# Patient Record
Sex: Male | Born: 1960
Health system: Southern US, Community
[De-identification: ages and names within clinical notes are randomized; demographics above are authoritative.]

## PROBLEM LIST (undated history)

## (undated) DIAGNOSIS — I10 Essential (primary) hypertension: Secondary | ICD-10-CM

## (undated) DIAGNOSIS — N486 Induration penis plastica: Secondary | ICD-10-CM

## (undated) DIAGNOSIS — R002 Palpitations: Secondary | ICD-10-CM

## (undated) DIAGNOSIS — I252 Old myocardial infarction: Secondary | ICD-10-CM

## (undated) DIAGNOSIS — I2510 Atherosclerotic heart disease of native coronary artery without angina pectoris: Secondary | ICD-10-CM

## (undated) DIAGNOSIS — Z973 Presence of spectacles and contact lenses: Secondary | ICD-10-CM

## (undated) DIAGNOSIS — Z9289 Personal history of other medical treatment: Secondary | ICD-10-CM

## (undated) DIAGNOSIS — E785 Hyperlipidemia, unspecified: Secondary | ICD-10-CM

## (undated) DIAGNOSIS — S52502A Unspecified fracture of the lower end of left radius, initial encounter for closed fracture: Secondary | ICD-10-CM

## (undated) HISTORY — DX: Unspecified fracture of the lower end of left radius, initial encounter for closed fracture: S52.502A

## (undated) HISTORY — PX: NO PAST SURGERIES: SHX2092

## (undated) HISTORY — DX: Essential (primary) hypertension: I10

---

## 1987-03-30 ENCOUNTER — Emergency Department (HOSPITAL_COMMUNITY): Payer: Self-pay

## 1999-01-08 ENCOUNTER — Emergency Department (HOSPITAL_COMMUNITY): Admission: EM | Admit: 1999-01-08 | Discharge: 1999-01-08 | Payer: Self-pay | Admitting: Emergency Medicine

## 1999-06-07 ENCOUNTER — Emergency Department (HOSPITAL_COMMUNITY): Admission: EM | Admit: 1999-06-07 | Discharge: 1999-06-07 | Payer: Self-pay | Admitting: Emergency Medicine

## 2005-10-10 ENCOUNTER — Emergency Department (HOSPITAL_COMMUNITY): Admission: EM | Admit: 2005-10-10 | Discharge: 2005-10-10 | Payer: Self-pay | Admitting: Emergency Medicine

## 2012-03-03 ENCOUNTER — Inpatient Hospital Stay (HOSPITAL_BASED_OUTPATIENT_CLINIC_OR_DEPARTMENT_OTHER)
Admission: EM | Admit: 2012-03-03 | Discharge: 2012-03-03 | Disposition: A | Discharge status: Home / Self Care | Payer: Self-pay

## 2012-10-03 ENCOUNTER — Inpatient Hospital Stay (HOSPITAL_BASED_OUTPATIENT_CLINIC_OR_DEPARTMENT_OTHER)
Admission: EM | Admit: 2012-10-03 | Discharge: 2012-10-03 | Disposition: A | Discharge status: Home / Self Care | Payer: Self-pay

## 2012-10-18 ENCOUNTER — Ambulatory Visit (INDEPENDENT_AMBULATORY_CARE_PROVIDER_SITE_OTHER): Payer: BC Managed Care – PPO | Admitting: Family Medicine

## 2012-10-18 VITALS — BP 144/82 | HR 94 | Temp 98.7°F | Resp 17 | Ht 70.5 in | Wt 213.0 lb

## 2012-10-18 DIAGNOSIS — T148XXA Other injury of unspecified body region, initial encounter: Secondary | ICD-10-CM

## 2012-10-18 DIAGNOSIS — R509 Fever, unspecified: Secondary | ICD-10-CM

## 2012-10-18 DIAGNOSIS — K6289 Other specified diseases of anus and rectum: Secondary | ICD-10-CM

## 2012-10-18 LAB — POCT CBC
Granulocyte percent: 63.5 %G (ref 37–80)
HCT, POC: 38.2 % — AB (ref 43.5–53.7)
Hemoglobin: 12.1 g/dL — AB (ref 14.1–18.1)
Lymph, poc: 2.2 (ref 0.6–3.4)
MCH, POC: 30.6 pg (ref 27–31.2)
MCHC: 31.7 g/dL — AB (ref 31.8–35.4)
MCV: 96.8 fL (ref 80–97)
MID (cbc): 0.5 (ref 0–0.9)
MPV: 8.6 fL (ref 0–99.8)
POC Granulocyte: 4.6 (ref 2–6.9)
POC LYMPH PERCENT: 29.6 %L (ref 10–50)
POC MID %: 6.9 %M (ref 0–12)
Platelet Count, POC: 271 10*3/uL (ref 142–424)
RBC: 3.95 M/uL — AB (ref 4.69–6.13)
RDW, POC: 12.2 %
WBC: 7.3 10*3/uL (ref 4.6–10.2)

## 2012-10-18 LAB — IFOBT (OCCULT BLOOD): IFOBT: NEGATIVE

## 2012-10-18 LAB — POCT INFLUENZA A/B
Influenza A, POC: NEGATIVE
Influenza B, POC: NEGATIVE

## 2012-10-18 MED ORDER — LIDOCAINE-PRILOCAINE 2.5-2.5 % EX CREA: TOPICAL_CREAM | CUTANEOUS | Status: DC | PRN

## 2012-10-18 NOTE — Progress Notes (Signed)
[facility]:  Office Visit  Chief Complaint:  Chief Complaint  Patient presents with  . Influenza  . Nausea  . Back Pain    HPI: Alan Walker is a 52 y.o. male who complains of  3 day history of subjective fever, chills, dry cough. When he coughs he has pain in his  Rectum, sharp, throbbing, then it resolves. Denies constipation, diarrhea, blood in urine or stool. He is a [place] male, denies rectal sex, or engaging in any sex toys. No rashes/lesions.  + nausea with cough. Has not tried anything for it. UTD  flu vaccine.   Past Medical History  Diagnosis Date  . Asthma   . Hypertension    History reviewed. No pertinent past surgical history. History   Social History  . Marital Status: Married    Spouse Name: N/A    Number of Children: N/A  . Years of Education: N/A   Social History Main Topics  . Smoking status: Never Smoker   . Smokeless tobacco: None  . Alcohol Use: No  . Drug Use: No  . Sexually Active: Yes    Birth Control/ Protection: Abstinence   Other Topics Concern  . None   Social History Narrative  . None   History reviewed. No pertinent family history. No Known Allergies Prior to Admission medications   Not on File     ROS: The patient denies night sweats, unintentional weight loss, chest pain, palpitations, wheezing, dyspnea on exertion, nausea, vomiting, abdominal pain, dysuria, hematuria, melena, numbness, weakness, or tingling.   All other systems have been reviewed and were otherwise negative with the exception of those mentioned in the HPI and as above.    PHYSICAL EXAM: Filed Vitals:   10/18/12 1120  BP: 144/82  Pulse: 94  Temp: 98.7 F (37.1 C)  Resp: 17   Filed Vitals:   10/18/12 1120  Height: 5' 10.5" (1.791 m)  Weight: 213 lb (96.616 kg)   Body mass index is 30.13 kg/(m^2).  General: Alert, no acute distress HEENT:  Normocephalic, atraumatic, oropharynx patent.  Cardiovascular:  Regular rate and  rhythm, no rubs murmurs or gallops.  No Carotid bruits, radial pulse intact. No pedal edema.  Respiratory: Clear to auscultation bilaterally.  No wheezes, rales, or rhonchi.  No cyanosis, no use of accessory musculature GI: No organomegaly, abdomen is soft and non-tender, positive bowel sounds.  No masses. Skin: No rashes. Neurologic: Facial musculature symmetric. Psychiatric: Patient is appropriate throughout our interaction. Lymphatic: No cervical lymphadenopathy Musculoskeletal: Gait intact. GU-no masses, lesions, there is rectal abrasion at base of anus   LABS: Results for orders placed in visit on 10/18/12  POCT INFLUENZA A/B      Component Value Range   Influenza A, POC Negative     Influenza B, POC Negative    POCT CBC      Component Value Range   WBC 7.3  4.6 - 10.2 K/uL   Lymph, poc 2.2  0.6 - 3.4   POC LYMPH PERCENT 29.6  10 - 50 %L   MID (cbc) 0.5  0 - 0.9   POC MID % 6.9  0 - 12 %M   POC Granulocyte 4.6  2 - 6.9   Granulocyte percent 63.5  37 - 80 %G   RBC 3.95 (*) 4.69 - 6.13 M/uL   Hemoglobin 12.1 (*) 14.1 - 18.1 g/dL   HCT, POC 46.9 (*) 62.9 - 53.7 %   MCV 96.8  80 -  97 fL   MCH, POC 30.6  27 - 31.2 pg   MCHC 31.7 (*) 31.8 - 35.4 g/dL   RDW, POC 69.6     Platelet Count, POC 271  142 - 424 K/uL   MPV 8.6  0 - 99.8 fL  IFOBT (OCCULT BLOOD)      Component Value Range   IFOBT Negative       EKG/XRAY:   Primary read interpreted by Dr. Conley Rolls at [facility].   ASSESSMENT/PLAN: Encounter Diagnoses  Name Primary?  . Rectal pain Yes  . Fever and chills   . Abrasion    Viral illness-otc treatment Emla cream for rectal pain secondary to ? Rectal abrasion F/u prn      Alan Goth PHUONG, DO 10/20/2012 11:00 AM

## 2012-10-20 ENCOUNTER — Telehealth: Payer: Self-pay | Admitting: Family Medicine

## 2012-10-20 NOTE — Telephone Encounter (Signed)
error 

## 2014-09-16 ENCOUNTER — Ambulatory Visit (INDEPENDENT_AMBULATORY_CARE_PROVIDER_SITE_OTHER): Payer: BC Managed Care – PPO | Admitting: Physician Assistant

## 2014-09-16 ENCOUNTER — Ambulatory Visit (INDEPENDENT_AMBULATORY_CARE_PROVIDER_SITE_OTHER): Payer: BC Managed Care – PPO

## 2014-09-16 VITALS — BP 122/74 | HR 82 | Temp 97.4°F | Resp 17 | Ht 69.5 in | Wt 218.0 lb

## 2014-09-16 DIAGNOSIS — M25561 Pain in right knee: Secondary | ICD-10-CM

## 2014-09-16 MED ORDER — MELOXICAM 15 MG PO TABS: 15.0000 mg | ORAL_TABLET | Freq: Every day | ORAL | Status: DC

## 2014-09-16 NOTE — Patient Instructions (Signed)
Your knee xray was normal.  I suspect the pain and swelling you're having is due to overuse as it is painful both with too much exertion and becomes stiff with too much rest.  Please take the meloxicam once daily for the next 2-3 weeks.  Please be sure to ice after activities that make it painful.  Please continue to wear the knee brace daily.  If you're not feeling better in 2-3 weeks, please give [place] a call so we can refer you to ortho to ensure you didn't have a small meniscus tear or any other injury that's causing you this pain.

## 2014-09-16 NOTE — Progress Notes (Signed)
   Subjective:    Patient ID: MORIO WIDEN, male    DOB: 1961-02-07, 53 y.o.   MRN: [id number]  PCP: No primary care provider on file.  Chief Complaint  Patient presents with  . Knee Pain    right knee    There are no active problems to display for this patient.  Prior to Admission medications   Not on File   Medications, allergies, past medical history, surgical history, family history, social history and problem list reviewed and updated.  HPI  40 yom with no pertinent pmh presents with right knee pain.  He ran a 5K 3 wks ago did well. He trained for approx 2 wks prior to the event. Had no pain with running. After he drove home after the event and stepped down out of his car he had sudden onset sharp stabbing pains located both sides of knee and around to the back. No radiation of pain. Pain lasted for 2-3 mins then resolved on own. Since this event he has numerous episodes of similar pain. Mostly comes on when he sits too long and also when he walks for a while. He has noticed some swelling around his knee as well.   Review of Systems No CP, SOB, fever, chills, pleurisy, hemoptysis.     Objective:   Physical Exam  Constitutional: He is oriented to person, place, and time. He appears well-developed and well-nourished.  Non-toxic appearance. He does not have a sickly appearance. He does not appear ill. No distress.  BP 122/74 mmHg  Pulse 82  Temp(Src) 97.4 F (36.3 C) (Oral)  Resp 17  Ht 5' 9.5" (1.765 m)  Wt 218 lb (98.884 kg)  BMI 31.74 kg/m2  SpO2 98%   Musculoskeletal:       Left knee: He exhibits swelling and abnormal meniscus. He exhibits normal range of motion, no effusion, no LCL laxity and no MCL laxity. No tenderness found. No medial joint line, no lateral joint line, no MCL, no LCL and no patellar tendon tenderness noted.  Medial pain with mcl laxity testing. No mcl laxity however. No ttp. No patellar tendon ttp or pes anserine ttp. Negative ant/post drawer  testing. Mild medial pain with grind testing.   Neurological: He is alert and oriented to person, place, and time.   [facility] reading (PRIMARY) by  Dr. Tamala Julian. Findings: Normal. No acute abnormality.      Assessment & Plan:   75 yom with no pertinent pmh presents with right knee pain.  Right knee pain - Plan: DG Knee Complete 4 Views Right, meloxicam (MOBIC) 15 MG tablet --xr normal --pain with testing but no catching or grinding with grind test. No laxity on testing --suspect pain due to overuse - mobic/ice/brace --no ttp to suggest patellar tendonitis or pes anserine bursitis --pt instructed to contact office 2-3 wks if pain not improving, will refer to ortho at that point  Julieta Gutting, PA-C Physician Assistant-Certified [facility]  09/16/2014 11:54 AM

## 2014-09-22 DIAGNOSIS — I219 Acute myocardial infarction, unspecified: Secondary | ICD-10-CM

## 2014-09-22 HISTORY — DX: Acute myocardial infarction, unspecified: I21.9

## 2014-10-20 ENCOUNTER — Encounter (HOSPITAL_COMMUNITY): Payer: Self-pay | Admitting: Cardiovascular Disease

## 2014-10-20 ENCOUNTER — Inpatient Hospital Stay (HOSPITAL_COMMUNITY)
Admission: EM | Admit: 2014-10-20 | Discharge: 2014-10-23 | DRG: 280 | Disposition: A | Discharge status: Home / Self Care | Payer: BLUE CROSS/BLUE SHIELD | Attending: Cardiovascular Disease | Admitting: Cardiovascular Disease

## 2014-10-20 ENCOUNTER — Encounter (HOSPITAL_COMMUNITY)
Admission: EM | Disposition: A | Discharge status: Home / Self Care | Payer: Self-pay | Source: Home / Self Care | Attending: Cardiovascular Disease

## 2014-10-20 DIAGNOSIS — I10 Essential (primary) hypertension: Secondary | ICD-10-CM | POA: Diagnosis present

## 2014-10-20 DIAGNOSIS — I2102 ST elevation (STEMI) myocardial infarction involving left anterior descending coronary artery: Principal | ICD-10-CM | POA: Diagnosis present

## 2014-10-20 DIAGNOSIS — E785 Hyperlipidemia, unspecified: Secondary | ICD-10-CM | POA: Diagnosis present

## 2014-10-20 DIAGNOSIS — I2542 Coronary artery dissection: Secondary | ICD-10-CM | POA: Diagnosis present

## 2014-10-20 DIAGNOSIS — E876 Hypokalemia: Secondary | ICD-10-CM | POA: Diagnosis present

## 2014-10-20 DIAGNOSIS — I213 ST elevation (STEMI) myocardial infarction of unspecified site: Secondary | ICD-10-CM

## 2014-10-20 DIAGNOSIS — Z8249 Family history of ischemic heart disease and other diseases of the circulatory system: Secondary | ICD-10-CM

## 2014-10-20 DIAGNOSIS — H538 Other visual disturbances: Secondary | ICD-10-CM

## 2014-10-20 HISTORY — PX: LEFT HEART CATHETERIZATION WITH CORONARY ANGIOGRAM: SHX5451

## 2014-10-20 HISTORY — DX: Hyperlipidemia, unspecified: E78.5

## 2014-10-20 LAB — CBC
HCT: 35.8 % — ABNORMAL LOW (ref 39.0–52.0)
Hemoglobin: 12 g/dL — ABNORMAL LOW (ref 13.0–17.0)
MCH: 30.4 pg (ref 26.0–34.0)
MCHC: 33.5 g/dL (ref 30.0–36.0)
MCV: 90.6 fL (ref 78.0–100.0)
Platelets: 246 10*3/uL (ref 150–400)
RBC: 3.95 MIL/uL — ABNORMAL LOW (ref 4.22–5.81)
RDW: 11.8 % (ref 11.5–15.5)
WBC: 7.4 10*3/uL (ref 4.0–10.5)

## 2014-10-20 LAB — POCT I-STAT, CHEM 8
BUN: 16 mg/dL (ref 6–23)
Calcium, Ion: 1.04 mmol/L — ABNORMAL LOW (ref 1.12–1.23)
Chloride: 107 mmol/L (ref 96–112)
Creatinine, Ser: 1 mg/dL (ref 0.50–1.35)
Glucose, Bld: 156 mg/dL — ABNORMAL HIGH (ref 70–99)
HCT: 39 % (ref 39.0–52.0)
Hemoglobin: 13.3 g/dL (ref 13.0–17.0)
Potassium: 2.9 mmol/L — ABNORMAL LOW (ref 3.5–5.1)
Sodium: 142 mmol/L (ref 135–145)
TCO2: 20 mmol/L (ref 0–100)

## 2014-10-20 LAB — COMPREHENSIVE METABOLIC PANEL
ALT: 37 U/L (ref 0–53)
AST: 34 U/L (ref 0–37)
Albumin: 3.8 g/dL (ref 3.5–5.2)
Alkaline Phosphatase: 50 U/L (ref 39–117)
Anion gap: 6 (ref 5–15)
BUN: 15 mg/dL (ref 6–23)
CO2: 26 mmol/L (ref 19–32)
Calcium: 8.4 mg/dL (ref 8.4–10.5)
Chloride: 107 mmol/L (ref 96–112)
Creatinine, Ser: 1.08 mg/dL (ref 0.50–1.35)
GFR calc Af Amer: 89 mL/min — ABNORMAL LOW (ref >90)
GFR calc non Af Amer: 77 mL/min — ABNORMAL LOW (ref >90)
Glucose, Bld: 152 mg/dL — ABNORMAL HIGH (ref 70–99)
Potassium: 2.7 mmol/L — CL (ref 3.5–5.1)
Sodium: 139 mmol/L (ref 135–145)
Total Bilirubin: 0.6 mg/dL (ref 0.3–1.2)
Total Protein: 7.4 g/dL (ref 6.0–8.3)

## 2014-10-20 LAB — LIPID PANEL
Cholesterol: 195 mg/dL (ref 0–200)
HDL: 41 mg/dL (ref >39)
LDL Cholesterol: 138 mg/dL — ABNORMAL HIGH (ref 0–99)
Total CHOL/HDL Ratio: 4.8 RATIO
Triglycerides: 80 mg/dL (ref <150)
VLDL: 16 mg/dL (ref 0–40)

## 2014-10-20 LAB — CK TOTAL AND CKMB (NOT AT ARMC)
CK, MB: 9.1 ng/mL (ref 0.3–4.0)
Relative Index: 2.3 (ref 0.0–2.5)
Total CK: 395 U/L — ABNORMAL HIGH (ref 7–232)

## 2014-10-20 LAB — APTT: aPTT: 66 seconds — ABNORMAL HIGH (ref 24–37)

## 2014-10-20 LAB — PROTIME-INR
INR: 1.19 (ref 0.00–1.49)
Prothrombin Time: 15.2 seconds (ref 11.6–15.2)

## 2014-10-20 LAB — MRSA PCR SCREENING: MRSA by PCR: NEGATIVE

## 2014-10-20 LAB — TROPONIN I
Troponin I: 0.26 ng/mL — ABNORMAL HIGH (ref <0.031)
Troponin I: 53.34 ng/mL (ref <0.031)

## 2014-10-20 LAB — POCT ACTIVATED CLOTTING TIME: Activated Clotting Time: 319 seconds

## 2014-10-20 LAB — BRAIN NATRIURETIC PEPTIDE: B Natriuretic Peptide: 23.7 pg/mL (ref 0.0–100.0)

## 2014-10-20 SURGERY — LEFT HEART CATHETERIZATION WITH CORONARY ANGIOGRAM
Anesthesia: LOCAL

## 2014-10-20 MED ORDER — POTASSIUM CHLORIDE 10 MEQ/100ML IV SOLN
10.0000 meq | INTRAVENOUS | Status: AC
  Administered 2014-10-20 (×4): 10 meq via INTRAVENOUS
  Filled 2014-10-20 (×4): qty 100

## 2014-10-20 MED ORDER — HEPARIN SODIUM (PORCINE) 5000 UNIT/ML IJ SOLN: 5000.0000 [IU] | Freq: Three times a day (TID) | INTRAMUSCULAR | Status: DC

## 2014-10-20 MED ORDER — BIVALIRUDIN 250 MG IV SOLR
INTRAVENOUS | Status: AC
  Filled 2014-10-20: qty 250

## 2014-10-20 MED ORDER — NITROGLYCERIN 1 MG/10 ML FOR IR/CATH LAB
INTRA_ARTERIAL | Status: AC
  Filled 2014-10-20: qty 10

## 2014-10-20 MED ORDER — LOSARTAN POTASSIUM 25 MG PO TABS
25.0000 mg | ORAL_TABLET | Freq: Every day | ORAL | Status: DC
  Administered 2014-10-21 – 2014-10-22 (×2): 25 mg via ORAL
  Filled 2014-10-20 (×3): qty 1

## 2014-10-20 MED ORDER — HYDRALAZINE HCL 20 MG/ML IJ SOLN
10.0000 mg | Freq: Four times a day (QID) | INTRAMUSCULAR | Status: DC | PRN
  Administered 2014-10-20: 10 mg via INTRAVENOUS
  Filled 2014-10-20 (×2): qty 1

## 2014-10-20 MED ORDER — ASPIRIN EC 81 MG PO TBEC
81.0000 mg | DELAYED_RELEASE_TABLET | Freq: Every day | ORAL | Status: DC
  Administered 2014-10-21 – 2014-10-23 (×3): 81 mg via ORAL
  Filled 2014-10-20 (×3): qty 1

## 2014-10-20 MED ORDER — FENTANYL CITRATE 0.05 MG/ML IJ SOLN
INTRAMUSCULAR | Status: AC
  Filled 2014-10-20: qty 2

## 2014-10-20 MED ORDER — ACETAMINOPHEN 325 MG PO TABS
650.0000 mg | ORAL_TABLET | ORAL | Status: DC | PRN
  Administered 2014-10-22: 650 mg via ORAL
  Filled 2014-10-20 (×2): qty 2

## 2014-10-20 MED ORDER — NITROGLYCERIN 0.4 MG SL SUBL: 0.4000 mg | SUBLINGUAL_TABLET | SUBLINGUAL | Status: DC | PRN

## 2014-10-20 MED ORDER — METOPROLOL TARTRATE 25 MG PO TABS
25.0000 mg | ORAL_TABLET | Freq: Two times a day (BID) | ORAL | Status: DC
  Administered 2014-10-20: 25 mg via ORAL
  Filled 2014-10-20 (×3): qty 1

## 2014-10-20 MED ORDER — CETYLPYRIDINIUM CHLORIDE 0.05 % MT LIQD
7.0000 mL | Freq: Two times a day (BID) | OROMUCOSAL | Status: DC
Start: 2014-10-20 — End: 2014-10-21
  Administered 2014-10-20 – 2014-10-21 (×2): 7 mL via OROMUCOSAL

## 2014-10-20 MED ORDER — ONDANSETRON HCL 4 MG/2ML IJ SOLN: 4.0000 mg | Freq: Four times a day (QID) | INTRAMUSCULAR | Status: DC | PRN

## 2014-10-20 MED ORDER — POTASSIUM CHLORIDE CRYS ER 20 MEQ PO TBCR
20.0000 meq | EXTENDED_RELEASE_TABLET | Freq: Two times a day (BID) | ORAL | Status: DC
  Administered 2014-10-20 – 2014-10-23 (×6): 20 meq via ORAL
  Filled 2014-10-20 (×7): qty 1

## 2014-10-20 MED ORDER — MIDAZOLAM HCL 2 MG/2ML IJ SOLN
INTRAMUSCULAR | Status: AC
  Filled 2014-10-20: qty 2

## 2014-10-20 MED ORDER — ASPIRIN 300 MG RE SUPP
300.0000 mg | RECTAL | Status: AC
  Filled 2014-10-20: qty 1

## 2014-10-20 MED ORDER — HEPARIN (PORCINE) IN NACL 2-0.9 UNIT/ML-% IJ SOLN
INTRAMUSCULAR | Status: AC
  Filled 2014-10-20: qty 1500

## 2014-10-20 MED ORDER — SODIUM CHLORIDE 0.9 % IV SOLN
INTRAVENOUS | Status: DC
  Administered 2014-10-20: 17:00:00 via INTRAVENOUS

## 2014-10-20 MED ORDER — LABETALOL HCL 5 MG/ML IV SOLN
10.0000 mg | INTRAVENOUS | Status: DC | PRN
Start: 2014-10-20 — End: 2014-10-23
  Administered 2014-10-20 (×3): 10 mg via INTRAVENOUS
  Filled 2014-10-20: qty 4

## 2014-10-20 MED ORDER — LIDOCAINE HCL (PF) 1 % IJ SOLN
INTRAMUSCULAR | Status: AC
  Filled 2014-10-20: qty 30

## 2014-10-20 MED ORDER — LABETALOL HCL 5 MG/ML IV SOLN
INTRAVENOUS | Status: AC
  Administered 2014-10-20: 10 mg via INTRAVENOUS
  Filled 2014-10-20: qty 4

## 2014-10-20 MED ORDER — MORPHINE SULFATE 2 MG/ML IJ SOLN: 2.0000 mg | INTRAMUSCULAR | Status: DC | PRN

## 2014-10-20 MED ORDER — ASPIRIN 81 MG PO CHEW
324.0000 mg | CHEWABLE_TABLET | ORAL | Status: AC
  Filled 2014-10-20: qty 4

## 2014-10-20 MED ORDER — OXYCODONE-ACETAMINOPHEN 5-325 MG PO TABS: 1.0000 | ORAL_TABLET | ORAL | Status: DC | PRN

## 2014-10-20 NOTE — Progress Notes (Signed)
CRITICAL VALUE ALERT  Critical value received:  K 2.7 AND CK-MB 9.4  Date of notification:  10/20/14  Time of notification: 8891  Critical value read back: yes  Nurse who received alert: Evlyn Kanner, RN  MD notified (1st page): Cooper  Time of first page: 1730  MD notified (2nd page):   Time of second page:  Responding MD: Burt Knack  Time MD responded: 6945  Orders received for runs of KCl

## 2014-10-20 NOTE — CV Procedure (Signed)
    Cardiac Catheterization Procedure Note  Name: Alan Walker MRN: [id number] DOB: [birth year]  Procedure: Left Heart Cath, Selective Coronary Angiography, LV angiography  Indication: Inferior STEMI   Procedural Details: The right wrist was prepped, draped, and anesthetized with 1% lidocaine. Using the modified Seldinger technique, a 5/6 French Slender sheath was introduced into the right radial artery. 3 mg of verapamil was administered through the sheath, weight-based unfractionated heparin was administered intravenously. Standard Judkins catheters were used for selective coronary angiography and left ventriculography. Catheter exchanges were performed over an exchange length guidewire. There were no immediate procedural complications. A TR band was used for radial hemostasis at the completion of the procedure.  The patient was transferred to the post catheterization recovery area for further monitoring.  Procedural Findings: Hemodynamics: AO 148/99 LV 153/17  Coronary angiography: Coronary dominance: Codominant  Left mainstem: Widely patent with no obstruction. Widely patent vessel arising from the left coronary cusp.  Left anterior descending (LAD): The proximal LAD is widely patent. The vessel shows no evidence of obstruction. The first 2 diagonals are widely patent with no obstruction. In the mid LAD, the vessel tapers abruptly in an area of tortuosity. It terminates in the distal anterior wall and I suspect the vessel is totally occluded in that area. The vessel has a very typical appearance of a spontaneous coronary artery dissection with intramural hematoma present.  Left circumflex (LCx): Large-caliber vessel, smooth throughout its course. There is no obstructive disease noted. The vessel supplies a small first OM and a large second obtuse marginal branch  Right coronary artery (RCA): Small, nondominant or potentially codominant vessel without obstruction. The vessel is  smooth throughout its course.  Left ventriculography: The inferoapex is akinetic. The other LV wall segments are hyperdynamic with an estimated LVEF of 50-55%.  Estimated Blood Loss: Minimal  Final Conclusions:   1. Distal LAD occlusion with typical angiographic appearance of spontaneous coronary artery dissection 2. No other evidence of atherosclerotic or obstructive coronary artery disease 3. Mild segmental contraction abnormality of the left ventricle consistent with inferoapical myocardial infarction  Recommendations: I think this patient has had spontaneous coronary artery dissection. He has no risk factors for this, but his angiogram has a classic appearance. He is now stable with no chest pain. Based on the EKG injury current and wall motion abnormality, I suspect his LAD wrapped around the LV apex and supplied the inferoapical wall. He will be treated medically. Would avoid anticoagulation or dual antiplatelet therapy based on concern over extension of intramural hematoma in the LAD. Would treat with aspirin alone. Will check a lipid panel, and would only start a statin drug if he has significant hyperlipidemia. There apparently is some data that statin drugs may be detrimental in patients with spontaneous coronary dissection. The patient will be transferred to the CCU where he should remain an inpatient for at least another 48 hours.  Sherren Mocha MD, [facility] 10/20/2014, 4:43 PM

## 2014-10-20 NOTE — Progress Notes (Signed)
eLink Physician-Brief Progress Note Patient Name: Alan Walker DOB: July 25, 1961 MRN: [id number]   Date of Service  10/20/2014  HPI/Events of Note  54 yo man, admitted for STEMI, VF arrest s/p defib x 1, c/b coronary dissection.    eICU Interventions  - hypertensive on initial eval, will follow     Intervention Category Evaluation Type: New Patient Evaluation  Baptiste Littler S. 10/20/2014, 7:44 PM

## 2014-10-20 NOTE — H&P (Signed)
Patient ID: Alan Walker MRN: [id number], DOB/AGE: [birth year]   Admit date: 10/20/2014   Primary Physician: No primary care provider on file. Primary Cardiologist: New (Dr. Burt Knack)  Pt. Profile:  Alan Walker is a 54 y.o. male with a history of HTN and no past cardiac history who presented to [facility] via EMS as a CODE STEMI.  He denies a PMH of DM, HLD, CVA, CAD and does not take any medications. He states that he was treated for HTN in the past but does not take any mediations for this currently. He exercises regularly without CP or SOB. An urgent care note from 08/2014 reports knee pain after running a 5K. He was in his usual state of health until today when he developed sudden onset of substernal chest pain. He denies radiation, SOB, diaphoresis or n/v. He was sitting in his car and has just eaten some chips and coffee. He went to the barber shop and EMS was called. EMS found inferior ST elevation on ECG and a code STEMI was activated. He went into VF and was successfully defibrillated with one shock. He converted into ventricular bigeminy.    Problem List  Past Medical History  Diagnosis Date  . Hypertension     No past surgical history on file.   Allergies  No Known Allergies   Home Medications  Prior to Admission medications   Medication Sig Start Date End Date Taking? Authorizing Provider  meloxicam (MOBIC) 15 MG tablet Take 1 tablet (15 mg total) by mouth daily. 09/16/14   Araceli Bouche, PA    Family History  Family History  Problem Relation Age of Onset  . Hypertension Mother   . Asthma Son    Family Status  Relation Status Death Age  . Mother Deceased   . Father Alive   . Sister Alive   . Brother Deceased   . Daughter Alive   . Son Alive      Social History  History   Social History  . Marital Status: Married    Spouse Name: N/A    Number of Children: N/A  . Years of Education: N/A   Occupational History  . Not on file.   Social  History Main Topics  . Smoking status: Never Smoker   . Smokeless tobacco: Not on file  . Alcohol Use: No  . Drug Use: No  . Sexual Activity: Yes    Birth Control/ Protection: Abstinence   Other Topics Concern  . Not on file   Social History Narrative    All other systems reviewed and are otherwise negative except as noted above.  Physical Exam  There were no vitals taken for this visit.  General: Pleasant, NAD. In acute distress. Crying on cath lab table.  Psych: Normal affect. Neuro: Alert and oriented X 3. Moves all extremities spontaneously. HEENT: Normal  Neck: Supple without bruits or JVD. Lungs:  Resp regular and unlabored, CTA. Heart: RRR no s3, s4, or murmurs. Abdomen: Soft, non-tender, non-distended, BS + x 4.  Extremities: No clubbing, cyanosis or edema. DP/PT/Radials 2+ and equal bilaterally.  Labs  none  Radiology/Studies  No results found.  ECG  ST elevation in II, III and AVF.   ASSESSMENT AND PLAN  Alan Walker is a 54 y.o. male with a history of HTN and no past cardiac history who presented to [facility] via EMS as a CODE STEMI.  PLAN- taken back for urgent heart cath.   -- Will  obtain basic lab work including Troponin, Lipids, HgA1c for further risk stratification.     Judy Pimple, PA-C 10/20/2014, 3:57 PM  Pager [phone]  Patient seen, examined. Available data reviewed. Agree with findings, assessment, and plan as outlined by Angelena Form, PA-C. The patient is a 54 year old previously healthy male with history of high blood pressure but not on any medication. He developed sudden onset of substernal chest pain today. On EMS arrival he had inferior and anterolateral ST elevation. A code STEMI was called and he presents directly to the cardiac catheterization lab. The patient had ventricular fibrillation prior to arrival here and he was defibrillated successfully 1. On exam, he is alert and oriented in no distress. He has oxygen  by facemask in place. Lungs are clear. Heart is regular rate and rhythm. Abdomen is soft and nontender. Extremities show no edema. EKG shows most prominent ST segment elevation inferiorly there is also evidence of anterolateral injury.  In summary, this is a gentleman with ST segment elevation MI, with most prominent injury current involving the inferior leads. We are going to proceed with emergency cardiac catheterization and possible PCI.  Sherren Mocha, M.D. 10/20/2014 4:40 PM

## 2014-10-20 NOTE — Progress Notes (Signed)
Chaplain responded to Code Stemi in ED then subsequently in Cath Lab.  Met family in ED and escorted to Cath Lab waiting.  Notified cath team of family location and facilitated MD consult to update family on procedure.  Family then shown to waiting area in [place] and are waiting to see patient.  2H secretary notified of family's location.  Chaplain provided emotional support as well as the ministry of presence.  Chaplain will follow up if needed.    10/20/14 1600  Clinical Encounter Type  Visited With Patient;Family;Health care provider  Visit Type Initial;Code;Critical Care  Referral From Care management  Spiritual Encounters  Spiritual Needs Emotional  Stress Factors  Patient Stress Factors Exhausted;Health changes  Family Stress Factors Health changes   Geralyn Flash 10/20/2014 4:44 PM

## 2014-10-21 ENCOUNTER — Encounter (HOSPITAL_COMMUNITY): Payer: Self-pay | Admitting: Cardiovascular Disease

## 2014-10-21 DIAGNOSIS — E785 Hyperlipidemia, unspecified: Secondary | ICD-10-CM

## 2014-10-21 DIAGNOSIS — I10 Essential (primary) hypertension: Secondary | ICD-10-CM

## 2014-10-21 DIAGNOSIS — I2102 ST elevation (STEMI) myocardial infarction involving left anterior descending coronary artery: Principal | ICD-10-CM

## 2014-10-21 LAB — CBC
HCT: 36.9 % — ABNORMAL LOW (ref 39.0–52.0)
Hemoglobin: 12.3 g/dL — ABNORMAL LOW (ref 13.0–17.0)
MCH: 31.3 pg (ref 26.0–34.0)
MCHC: 33.3 g/dL (ref 30.0–36.0)
MCV: 93.9 fL (ref 78.0–100.0)
Platelets: 297 10*3/uL (ref 150–400)
RBC: 3.93 MIL/uL — ABNORMAL LOW (ref 4.22–5.81)
RDW: 12.2 % (ref 11.5–15.5)
WBC: 7.9 10*3/uL (ref 4.0–10.5)

## 2014-10-21 LAB — RAPID URINE DRUG SCREEN, HOSP PERFORMED
Amphetamines: NOT DETECTED
Barbiturates: NOT DETECTED
Benzodiazepines: POSITIVE — AB
Cocaine: NOT DETECTED
Opiates: NOT DETECTED
Tetrahydrocannabinol: NOT DETECTED

## 2014-10-21 LAB — BASIC METABOLIC PANEL
Anion gap: 3 — ABNORMAL LOW (ref 5–15)
BUN: 13 mg/dL (ref 6–23)
CO2: 27 mmol/L (ref 19–32)
Calcium: 8.9 mg/dL (ref 8.4–10.5)
Chloride: 105 mmol/L (ref 96–112)
Creatinine, Ser: 0.95 mg/dL (ref 0.50–1.35)
GFR calc Af Amer: >90 mL/min (ref >90)
GFR calc non Af Amer: >90 mL/min (ref >90)
Glucose, Bld: 118 mg/dL — ABNORMAL HIGH (ref 70–99)
Potassium: 3.9 mmol/L (ref 3.5–5.1)
Sodium: 135 mmol/L (ref 135–145)

## 2014-10-21 LAB — LIPID PANEL
Cholesterol: 191 mg/dL (ref 0–200)
HDL: 47 mg/dL (ref >39)
LDL Cholesterol: 137 mg/dL — ABNORMAL HIGH (ref 0–99)
Total CHOL/HDL Ratio: 4.1 RATIO
Triglycerides: 33 mg/dL (ref <150)
VLDL: 7 mg/dL (ref 0–40)

## 2014-10-21 LAB — TROPONIN I
Troponin I: 56.16 ng/mL (ref <0.031)
Troponin I: 24.12 ng/mL (ref <0.031)

## 2014-10-21 LAB — HEMOGLOBIN A1C
Hgb A1c MFr Bld: 5.5 % (ref 4.8–5.6)
Mean Plasma Glucose: 111 mg/dL

## 2014-10-21 MED ORDER — CARVEDILOL 3.125 MG PO TABS
12.5000 mg | ORAL_TABLET | Freq: Two times a day (BID) | ORAL | Status: DC
  Filled 2014-10-21 (×3): qty 4

## 2014-10-21 MED ORDER — CARVEDILOL 12.5 MG PO TABS
12.5000 mg | ORAL_TABLET | Freq: Two times a day (BID) | ORAL | Status: DC
  Administered 2014-10-21 – 2014-10-23 (×5): 12.5 mg via ORAL
  Filled 2014-10-21 (×7): qty 1

## 2014-10-21 MED ORDER — ATORVASTATIN CALCIUM 20 MG PO TABS
20.0000 mg | ORAL_TABLET | Freq: Every day | ORAL | Status: DC
  Administered 2014-10-21 – 2014-10-22 (×2): 20 mg via ORAL
  Filled 2014-10-21 (×3): qty 1

## 2014-10-21 NOTE — Progress Notes (Signed)
CARDIAC REHAB PHASE I   PRE:  Rate/Rhythm: 78 SR  BP:  Supine:   Sitting: 113/72  Standing:    SaO2: 96% RA  MODE:  Ambulation: 600 ft   POST:  Rate/Rhythm: 91 SR  BP:  Supine:   Sitting: 124/78  Standing:    SaO2: 98 RA Tolerated ambulation well with difficulty or angina.  Education completed re:MI, activity restrictions, exercise progression, Angina symptoms, NTG usage, when to call 911 and the doctor.  Referred to phase II cardiac rehab.  Instructed to walk with staff again today and 3 times tomorrow.   [place] RN, BSN 10/21/2014 3:22 PM

## 2014-10-21 NOTE — Progress Notes (Addendum)
CRITICAL VALUE ALERT  Critical value received:  Troponin 53.34 Date of notification:  10/20/14  Time of notification:  2130  Critical value read back:Yes.    Nurse who received alert:  Dennison Mascot  MD notified (1st page):  N/a expected value

## 2014-10-21 NOTE — Progress Notes (Addendum)
PROGRESS NOTE  Subjective:    54 yo male ,  Hx of HTN, HLD,   With family hx of DM  And CVA  , presented with STEMI  He was not on any meds.  He runs regularly  He had sudden onset of CP after eating some chips and coffee.  Went to the [employer], EMS was called.   I have personally reviewed the cath films.  Cath showed apparent  spontaneous dissection of the distal LAD  Objective:    Vital Signs:   Temp:  [98 F (36.7 C)-99.1 F (37.3 C)] 98.4 F (36.9 C) (01/30 0700) Pulse Rate:  [65-86] 68 (01/30 0700) Resp:  [10-23] 19 (01/30 0700) BP: (116-176)/(67-122) 145/98 mmHg (01/30 0700) SpO2:  [95 %-100 %] 100 % (01/30 0700) Weight:  [218 lb 0.6 oz (98.9 kg)] 218 lb 0.6 oz (98.9 kg) (01/29 2000)  Last BM Date: 10/20/14   24-hour weight change: Weight change:   Weight trends: Filed Weights   10/20/14 1748 10/20/14 2000  Weight: 218 lb 0.6 oz (98.9 kg) 218 lb 0.6 oz (98.9 kg)    Intake/Output:  01/29 0701 - 01/30 0700 In: 850 [I.V.:450; IV Piggyback:400] Out: 1150 [Urine:1150]     Physical Exam: BP 145/98 mmHg  Pulse 68  Temp(Src) 98.4 F (36.9 C) (Oral)  Resp 19  Wt 218 lb 0.6 oz (98.9 kg)  SpO2 100%  Wt Readings from Last 3 Encounters:  10/20/14 218 lb 0.6 oz (98.9 kg)  09/16/14 218 lb (98.884 kg)  10/18/12 213 lb (96.616 kg)    General: Vital signs reviewed and noted.  Comfortable   Head: Normocephalic, atraumatic.  Eyes: conjunctivae/corneas clear.  EOM's intact.   Throat: normal  Neck:  normal   Lungs:    clear   Heart:  RR, no rub  Abdomen:  Soft, non-tender, non-distended    Extremities: Right radial cath site looks good.     Neurologic: A&O X3, CN II - XII are grossly intact.   Psych: Normal     Labs: BMET:  Recent Labs  10/20/14 1609 10/21/14 0335  NA 142  139 135  K 2.9*  2.7* 3.9  CL 107  107 105  CO2 26 27  GLUCOSE 156*  152* 118*  BUN 16  15 13   CREATININE 1.00  1.08 0.95  CALCIUM 8.4 8.9    Liver function  tests:  Recent Labs  10/20/14 1609  AST 34  ALT 37  ALKPHOS 50  BILITOT 0.6  PROT 7.4  ALBUMIN 3.8   No results for input(s): LIPASE, AMYLASE in the last 72 hours.  CBC:  Recent Labs  10/20/14 1609 10/21/14 0335  WBC 7.4 7.9  HGB 13.3  12.0* 12.3*  HCT 39.0  35.8* 36.9*  MCV 90.6 93.9  PLT 246 297    Cardiac Enzymes:  Recent Labs  10/20/14 1609 10/20/14 2124 10/21/14 0335  CKTOTAL 395*  --   --   CKMB 9.1*  --   --   TROPONINI 0.26* 53.34* 56.16*    Coagulation Studies:  Recent Labs  10/20/14 1609  LABPROT 15.2  INR 1.19    Other: Invalid input(s): POCBNP No results for input(s): DDIMER in the last 72 hours.  Recent Labs  10/20/14 1609  HGBA1C 5.5    Recent Labs  10/21/14 0335  CHOL 191  HDL 47  LDLCALC 137*  TRIG 33  CHOLHDL 4.1   No results for input(s): TSH, T4TOTAL, T3FREE,  THYROIDAB in the last 72 hours.  Invalid input(s): FREET3 No results for input(s): VITAMINB12, FOLATE, FERRITIN, TIBC, IRON, RETICCTPCT in the last 72 hours.   Other results:  EKG :   Medications:    Infusions: . sodium chloride 50 mL/hr at 10/20/14 2330    Scheduled Medications: . antiseptic oral rinse  7 mL Mouth Rinse BID  . aspirin  324 mg Oral NOW   Or  . aspirin  300 mg Rectal NOW  . aspirin EC  81 mg Oral Daily  . losartan  25 mg Oral Daily  . metoprolol tartrate  25 mg Oral BID  . potassium chloride  20 mEq Oral BID     Assessment/ Plan:   Active Problems:  1.   ST elevation (STEMI) myocardial infarction involving left anterior descending coronary artery Due to spontansous dissection of the distal LAD.   Will continue ASA daily.   LV gram shows apical anterior and apical inferior  Akinesis.  He is not having any angina Rhythm has been stable. Has been started on Losartan 25 a day. Will start low dose coreg.  2. Essential HTN:  Continue losartan.  Will start coreg.   3. Hyperlipidemia:  Will start Atorvastatin .  LDL is 137.    4. Hypokalemia:  Improved    Disposition:  Will keep in ICU.  Could transfer to step down if neede.d .  Length of Stay: 1  Thayer Headings, Brooke Bonito., MD, [facility] 10/21/2014, 7:50 AM Office [phone] Pager [phone]

## 2014-10-21 NOTE — Progress Notes (Signed)
CRITICAL VALUE ALERT  Critical value received:  Trop 24.12  Date of notification:  10/21/14  Time of notification:  1800  Critical value read back:Yes.    Nurse who received alert:  Virgie Dad, RN  MD notified (1st page):  N/A expected value post MI

## 2014-10-22 DIAGNOSIS — R079 Chest pain, unspecified: Secondary | ICD-10-CM

## 2014-10-22 NOTE — Progress Notes (Signed)
PROGRESS NOTE  Subjective:    54 yo male ,  Hx of HTN, HLD,   With family hx of DM  And CVA  , presented with STEMI  He was not on any meds.  He runs regularly  He had sudden onset of CP after eating some chips and coffee.  Went to the [facility], EMS was called.   I have personally reviewed the cath films.  Cath showed apparent  spontaneous dissection of the distal LAD  Feels well today .  Will transfer to floor   Objective:    Vital Signs:   Temp:  [98.1 F (36.7 C)-98.8 F (37.1 C)] 98.3 F (36.8 C) (01/31 0730) Pulse Rate:  [66-72] 72 (01/31 0730) Resp:  [13-25] 13 (01/30 2318) BP: (89-137)/(43-90) 107/68 mmHg (01/31 0730) SpO2:  [96 %-99 %] 96 % (01/31 0730)  Last BM Date: 10/19/14   24-hour weight change: Weight change:   Weight trends: Filed Weights   10/20/14 1748 10/20/14 2000  Weight: 218 lb 0.6 oz (98.9 kg) 218 lb 0.6 oz (98.9 kg)    Intake/Output:  01/30 0701 - 01/31 0700 In: 1140 [P.O.:940; I.V.:200] Out: -      Physical Exam: BP 107/68 mmHg  Pulse 72  Temp(Src) 98.3 F (36.8 C) (Oral)  Resp 13  Ht 5\' 9"  (1.753 m)  Wt 218 lb 0.6 oz (98.9 kg)  BMI 32.18 kg/m2  SpO2 96%  Wt Readings from Last 3 Encounters:  10/20/14 218 lb 0.6 oz (98.9 kg)  09/16/14 218 lb (98.884 kg)  10/18/12 213 lb (96.616 kg)    General: Vital signs reviewed and noted.  Comfortable   Head: Normocephalic, atraumatic.  Eyes: conjunctivae/corneas clear.  EOM's intact.   Throat: normal  Neck:  normal   Lungs:    clear   Heart:  RR, no rub  Abdomen:  Soft, non-tender, non-distended    Extremities: Right radial cath site looks good.     Neurologic: A&O X3, CN II - XII are grossly intact.   Psych: Normal     Labs: BMET:  Recent Labs  10/20/14 1609 10/21/14 0335  NA 142  139 135  K 2.9*  2.7* 3.9  CL 107  107 105  CO2 26 27  GLUCOSE 156*  152* 118*  BUN 16  15 13   CREATININE 1.00  1.08 0.95  CALCIUM 8.4 8.9    Liver function  tests:  Recent Labs  10/20/14 1609  AST 34  ALT 37  ALKPHOS 50  BILITOT 0.6  PROT 7.4  ALBUMIN 3.8   No results for input(s): LIPASE, AMYLASE in the last 72 hours.  CBC:  Recent Labs  10/20/14 1609 10/21/14 0335  WBC 7.4 7.9  HGB 13.3  12.0* 12.3*  HCT 39.0  35.8* 36.9*  MCV 90.6 93.9  PLT 246 297    Cardiac Enzymes:  Recent Labs  10/20/14 1609 10/20/14 2124 10/21/14 0335 10/21/14 1620  CKTOTAL 395*  --   --   --   CKMB 9.1*  --   --   --   TROPONINI 0.26* 53.34* 56.16* 24.12*    Coagulation Studies:  Recent Labs  10/20/14 1609  LABPROT 15.2  INR 1.19    Other: Invalid input(s): POCBNP No results for input(s): DDIMER in the last 72 hours.  Recent Labs  10/20/14 1609  HGBA1C 5.5    Recent Labs  10/21/14 0335  CHOL 191  HDL 47  LDLCALC 137*  TRIG  33  CHOLHDL 4.1   No results for input(s): TSH, T4TOTAL, T3FREE, THYROIDAB in the last 72 hours.  Invalid input(s): FREET3 No results for input(s): VITAMINB12, FOLATE, FERRITIN, TIBC, IRON, RETICCTPCT in the last 72 hours.   Other results:  EKG :   Medications:    Infusions:    Scheduled Medications: . aspirin EC  81 mg Oral Daily  . atorvastatin  20 mg Oral q1800  . carvedilol  12.5 mg Oral BID WC  . losartan  25 mg Oral Daily  . potassium chloride  20 mEq Oral BID     Assessment/ Plan:   Active Problems:  1.   ST elevation (STEMI) myocardial infarction involving left anterior descending coronary artery Due to spontansous dissection of the distal LAD.   Will continue ASA daily.   LV gram shows apical anterior and apical inferior  Akinesis.  He is not having any angina Rhythm has been stable. Has been started on Losartan 25 a day. Will start low dose coreg.  2. Essential HTN:  Continue losartan.  Will start coreg.   3. Hyperlipidemia:  Will start Atorvastatin .  LDL is 137.   4. Hypokalemia:  Improved    Disposition:  Transfer to floor.  Anticipate DC tomorrow.    Length of Stay: 2  Thayer Headings, Brooke Bonito., MD, [facility] 10/22/2014, 8:02 AM Office [phone] Pager [phone]

## 2014-10-22 NOTE — Progress Notes (Signed)
Report called to Rey, RN 3W; pt transported with telemetry monitor; questions encouraged and answered

## 2014-10-22 NOTE — Progress Notes (Signed)
Echocardiogram 2D Echocardiogram has been performed.  Alan Walker 10/22/2014, 11:48 AM

## 2014-10-23 ENCOUNTER — Encounter (HOSPITAL_COMMUNITY): Payer: Self-pay | Admitting: Physician Assistant

## 2014-10-23 ENCOUNTER — Other Ambulatory Visit: Payer: Self-pay | Admitting: Physician Assistant

## 2014-10-23 ENCOUNTER — Inpatient Hospital Stay (HOSPITAL_COMMUNITY): Payer: BLUE CROSS/BLUE SHIELD

## 2014-10-23 DIAGNOSIS — E876 Hypokalemia: Secondary | ICD-10-CM

## 2014-10-23 DIAGNOSIS — E785 Hyperlipidemia, unspecified: Secondary | ICD-10-CM | POA: Diagnosis present

## 2014-10-23 DIAGNOSIS — I10 Essential (primary) hypertension: Secondary | ICD-10-CM | POA: Diagnosis present

## 2014-10-23 MED ORDER — ASPIRIN 81 MG PO TABS: 81.0000 mg | ORAL_TABLET | Freq: Every day | ORAL | Status: DC

## 2014-10-23 MED ORDER — NITROGLYCERIN 0.4 MG SL SUBL: 0.4000 mg | SUBLINGUAL_TABLET | SUBLINGUAL | Status: DC | PRN

## 2014-10-23 MED ORDER — ATORVASTATIN CALCIUM 20 MG PO TABS: 20.0000 mg | ORAL_TABLET | Freq: Every day | ORAL | Status: DC

## 2014-10-23 MED ORDER — CARVEDILOL 12.5 MG PO TABS: 12.5000 mg | ORAL_TABLET | Freq: Two times a day (BID) | ORAL | Status: DC

## 2014-10-23 MED FILL — Sodium Chloride IV Soln 0.9%: INTRAVENOUS | Qty: 50 | Status: AC

## 2014-10-23 NOTE — Progress Notes (Signed)
Pt discharged via W/C, accompanied by family, condition stable.

## 2014-10-23 NOTE — Discharge Summary (Signed)
CARDIOLOGY DISCHARGE SUMMARY   Patient ID: Alan Walker MRN: [id number] DOB/AGE: [birth year] 54 y.o.  Admit date: 10/20/2014 Discharge date: 10/23/2014  PCP: No primary care provider on file. Primary Cardiologist: Dr. Burt Knack  Primary Discharge Diagnosis:   ST elevation (STEMI) myocardial infarction involving left anterior descending coronary artery - dissection, so no PCI  Secondary Discharge Diagnosis:    Dyslipidemia (high LDL; low HDL)   Hypertension   Hypokalemia   Vfib arrest  Procedures: Left Heart Cath, Selective Coronary Angiography, LV angiography, 2 D Echocardiogram  Hospital Course: Alan Walker is a 54 y.o. male with no previous history of CAD. He exercises regularly, has been diagnosed with HTN, but has not been on medications in months.  He had onset of SSCP and called EMS, his ECG was consistent with Code STEMI. He was en route to the cath lab when he had a ventricular fibrillation arrest and was defibrillated x 1. He was then in ventricular bigeminy. He was transported directly to the cath lab.  Cardiac catheterization results are below. He had a distal LAD occlusion, the appearance was consistent with spontaneous dissection. No PCI was indicated. He had no other significant CAD and his EF was at the lower edge of normal. Medical therapy was recommended. He was started on a beta blocker but no aspirin because of the dissection.   His potassium on admission was noted to be low, as low as 2.7. It was supplemented and improved. He will not be discharged on potassium supplementation but is to get a BMET at his outpatient visit. If his potassium is once again low, he will need evaluation for hyperaldosteronism.  After his lipid profile came back and showed dyslipidemia, he was started on a statin but only at a low dose, again because of the dissection.   His blood sugars were elevated, but hemoglobin A1c was within normal limits. He had no fasting blood sugars  greater than 120, and no nonfasting blood sugars greater than 200.  He was started on a beta blocker and an ARB. He is to continue the beta blocker discharge but will not be discharged on the ARB because his systolic blood pressure was borderline in the ARB was held at times because of this. Possibly an ARB can be added as an outpatient.  He was seen by cardiac rehabilitation and educated on MI restrictions, heart-healthy lifestyle modifications and exercise guidelines. He is strongly encouraged to follow-up with cardiac rehabilitation as an outpatient, and is to defer his home exercise program until cleared by them.  He requested to go back to work in 2 weeks and stated that he would be able to do light duty. He is to have an office visit prior to that with a repeat BMET, to make sure that he is recovering well.  He had intermittent brief episodes of bilateral blurred vision. He had been having these at times for a couple of months. He had a CT of the head which showed some possible white matter disease, but nothing acute. His symptoms resolved spontaneously.  On 02/01, he was seen by Dr. Claiborne Billings and all data were reviewed. He was ambulating without chest pain or shortness of breath. No further inpatient workup was indicated and he is considered stable for discharge, to follow up as an outpatient.  Labs:   Lab Results  Component Value Date   WBC 7.9 10/21/2014   HGB 12.3* 10/21/2014   HCT 36.9* 10/21/2014   MCV 93.9 10/21/2014  PLT 297 10/21/2014     Recent Labs Lab 10/20/14 1609 10/21/14 0335  NA 142  139 135  K 2.9*  2.7* 3.9  CL 107  107 105  CO2 26 27  BUN 16  15 13   CREATININE 1.00  1.08 0.95  CALCIUM 8.4 8.9  PROT 7.4  --   BILITOT 0.6  --   ALKPHOS 50  --   ALT 37  --   AST 34  --   GLUCOSE 156*  152* 118*    Recent Labs  10/20/14 2124 10/21/14 0335 10/21/14 1620  TROPONINI 53.34* 56.16* 24.12*   Lipid Panel     Component Value Date/Time   CHOL 191  10/21/2014 0335   TRIG 33 10/21/2014 0335   HDL 47 10/21/2014 0335   CHOLHDL 4.1 10/21/2014 0335   VLDL 7 10/21/2014 0335   LDLCALC 137* 10/21/2014 0335   No results for input(s): INR in the last 72 hours. Lab Results  Component Value Date   HGBA1C 5.5 10/20/2014      Radiology: Ct Head Wo Contrast 10/23/2014   CLINICAL DATA:  Pain behind the eyes with blurred vision and dizziness.  EXAM: CT HEAD WITHOUT CONTRAST  TECHNIQUE: Contiguous axial images were obtained from the base of the skull through the vertex without contrast.  COMPARISON:  None  FINDINGS: Scattered areas of low density throughout the subcortical white matter. The gray-white differentiation along the right frontal lobe on series 201, image 16 is slightly unusual and this could be related to the white matter disease. There are small dystrophic calcifications in the left basal ganglia region. These appear to be separate from the left MCA M1 branch. Calcifications measure up to 7 mm. No evidence for acute hemorrhage, mass lesion, midline shift, hydrocephalus or large area of infarct.  The paranasal sinuses are clear.  No acute bone abnormality.  IMPRESSION: No acute intracranial abnormality.  Dystrophic calcifications in the left basal ganglia region. These calcifications are atypical and the location is not suggestive for an aneurysm.  Patchy low density throughout the subcortical white matter suggesting underlying white matter disease. Findings could represent chronic small vessel ischemic disease but nonspecific.   Electronically Signed   By: Markus Daft M.D.   On: 10/23/2014 13:14   Echo: 10/22/2014 Study Conclusions - Left ventricle: The cavity size was normal. Wall thickness was normal. Systolic function was normal. The estimated ejection fraction was in the range of 55% to 60%. Although no diagnostic regional wall motion abnormality was identified, this possibility cannot be completely excluded on the basis of this  study. Doppler parameters are consistent with abnormal left ventricular relaxation (grade 1 diastolic dysfunction). - Aortic valve: There was trivial regurgitation. Valve area (Vmax): 3.25 cm^2.  Cardiac Cath: 10/20/2014 Coronary angiography: Coronary dominance: Codominant Left mainstem: Widely patent with no obstruction. Widely patent vessel arising from the left coronary cusp. Left anterior descending (LAD): The proximal LAD is widely patent. The vessel shows no evidence of obstruction. The first 2 diagonals are widely patent with no obstruction. In the mid LAD, the vessel tapers abruptly in an area of tortuosity. It terminates in the distal anterior wall and I suspect the vessel is totally occluded in that area. The vessel has a very typical appearance of a spontaneous coronary artery dissection with intramural hematoma present. Left circumflex (LCx): Large-caliber vessel, smooth throughout its course. There is no obstructive disease noted. The vessel supplies a small first OM and a large second obtuse marginal branch Right  coronary artery (RCA): Small, nondominant or potentially codominant vessel without obstruction. The vessel is smooth throughout its course. Left ventriculography: The inferoapex is akinetic. The other LV wall segments are hyperdynamic with an estimated LVEF of 50-55%. Estimated Blood Loss: Minimal Final Conclusions:  1. Distal LAD occlusion with typical angiographic appearance of spontaneous coronary artery dissection 2. No other evidence of atherosclerotic or obstructive coronary artery disease 3. Mild segmental contraction abnormality of the left ventricle consistent with inferoapical myocardial infarction Recommendations: I think this patient has had spontaneous coronary artery dissection. He has no risk factors for this, but his angiogram has a classic appearance. He is now stable with no chest pain. Based on the EKG injury current and wall motion abnormality, I  suspect his LAD wrapped around the LV apex and supplied the inferoapical wall. He will be treated medically. Would avoid anticoagulation or dual antiplatelet therapy based on concern over extension of intramural hematoma in the LAD. Would treat with aspirin alone. Will check a lipid panel, and would only start a statin drug if he has significant hyperlipidemia. There apparently is some data that statin drugs may be detrimental in patients with spontaneous coronary dissection. The patient will be transferred to the CCU where he should remain an inpatient for at least another 48 hours.  EKG: 10/22/2014 SR, Q waves inferior leads (and small lateral Q's seen) Evolving MI changes lateral leads  FOLLOW UP PLANS AND APPOINTMENTS No Known Allergies   Medication List    STOP taking these medications        meloxicam 15 MG tablet  Commonly known as:  MOBIC      TAKE these medications        aspirin 81 MG tablet  Take 1 tablet (81 mg total) by mouth daily.     atorvastatin 20 MG tablet  Commonly known as:  LIPITOR  Take 1 tablet (20 mg total) by mouth daily at 6 PM.     carvedilol 12.5 MG tablet  Commonly known as:  COREG  Take 1 tablet (12.5 mg total) by mouth 2 (two) times daily with a meal.     nitroGLYCERIN 0.4 MG SL tablet  Commonly known as:  NITROSTAT  Place 1 tablet (0.4 mg total) under the tongue every 5 (five) minutes as needed for chest pain.        Discharge Instructions    Amb Referral to Cardiac Rehabilitation    Complete by:  As directed      Diet - low sodium heart healthy    Complete by:  As directed      Increase activity slowly    Complete by:  As directed           Follow-up Information    Follow up with Sherren Mocha, MD.   Specialty:  Cardiology   Why:  Lab work and office visit with MD/PA/NP prior to returning to work.   Contact information:   [address] [place] [address] [phone]       BRING ALL MEDICATIONS WITH YOU  TO FOLLOW UP APPOINTMENTS  Time spent with patient to include physician time: 45 min Signed: Rosaria Ferries, PA-C 10/23/2014, 8:10 PM Co-Sign MD

## 2014-10-23 NOTE — Progress Notes (Signed)
CARDIAC REHAB PHASE I   PRE:  Rate/Rhythm: 88 SR    BP: sitting 100/80    SaO2:   MODE:  Ambulation: 550 ft   POST:  Rate/Rhythm: 106 ST    BP: sitting 104/80     SaO2:   Tolerated well, no c/o. Denies CP. Wants to go home. Ed reviewed/reinforced.  [id number]   Darrick Meigs CES, ACSM 10/23/2014 9:01 AM

## 2014-10-23 NOTE — Discharge Instructions (Signed)
PLEASE REMEMBER TO BRING ALL OF YOUR MEDICATIONS TO EACH OF YOUR FOLLOW-UP OFFICE VISITS. ° °PLEASE ATTEND ALL SCHEDULED FOLLOW-UP APPOINTMENTS.  ° °Activity: Increase activity slowly as tolerated. You may shower, but no soaking baths (or swimming) for 1 week. No driving for 1 week. No lifting over 5 lbs for 2 weeks. No sexual activity for 1 week.  ° °You May Return to Work: in 3 weeks (if applicable) ° °Wound Care: You may wash cath site gently with soap and water. Keep cath site clean and dry. If you notice pain, swelling, bleeding or pus at your cath site, please call [phone]. ° ° ° °Cardiac Cath Site Care °Refer to this sheet in the next few weeks. These instructions provide you with information on caring for yourself after your procedure. Your caregiver may also give you more specific instructions. Your treatment has been planned according to current medical practices, but problems sometimes occur. Call your caregiver if you have any problems or questions after your procedure. °HOME CARE INSTRUCTIONS °· You may shower 24 hours after the procedure. Remove the bandage (dressing) and gently wash the site with plain soap and water. Gently pat the site dry.  °· Do not apply powder or lotion to the site.  °· Do not sit in a bathtub, swimming pool, or whirlpool for 5 to 7 days.  °· No bending, squatting, or lifting anything over 10 pounds (4.5 kg) as directed by your caregiver.  °· Inspect the site at least twice daily.  °· Do not drive home if you are discharged the same day of the procedure. Have someone else drive you.  °· You may drive 24 hours after the procedure unless otherwise instructed by your caregiver.  °What to expect: °· Any bruising will usually fade within 1 to 2 weeks.  °· Blood that collects in the tissue (hematoma) may be painful to the touch. It should usually decrease in size and tenderness within 1 to 2 weeks.  °SEEK IMMEDIATE MEDICAL CARE IF: °· You have unusual pain at the site or down the  affected limb.  °· You have redness, warmth, swelling, or pain at the site.  °· You have drainage (other than a small amount of blood on the dressing).  °· You have chills.  °· You have a fever or persistent symptoms for more than 72 hours.  °· You have a fever and your symptoms suddenly get worse.  °· Your leg becomes pale, cool, tingly, or numb.  °· You have heavy bleeding from the site. Hold pressure on the site.  °Document Released: 10/11/2010 Document Revised: 08/28/2011 Document Reviewed:  ° °

## 2014-10-23 NOTE — Progress Notes (Signed)
Pt complaining of visual disturbance, states it started about 20 minutes ago, describes starting as sudden sharp pain behind eyes and then intermittent blurring and clearing of vision, states it lasted several minutes, reports symptoms have all resolved at present, states this has happened several times over the past six months.

## 2014-10-23 NOTE — Care Management Note (Signed)
    Page 1 of 1   10/23/2014     11:53:15 AM CARE MANAGEMENT NOTE 10/23/2014  Patient:  Alan Walker, Alan Walker   Account Number:  [id number]  Date Initiated:  10/23/2014  Documentation initiated by:  GRAVES-BIGELOW,Soleia Badolato  Subjective/Objective Assessment:   Pt admitted for [facility]. Plan for d/c home today.     Action/Plan:   CM did provide pt the [facility] Number for PCP. No further needs identified by CM at this time.   Anticipated DC Date:     Anticipated DC Plan:           Choice offered to / List presented to:             Status of service:  Completed, signed off Medicare Important Message given?  NO (If response is "NO", the following Medicare IM given date fields will be blank) Date Medicare IM given:   Medicare IM given by:   Date Additional Medicare IM given:   Additional Medicare IM given by:    Discharge Disposition:  HOME/SELF CARE  Per UR Regulation:  Reviewed for med. necessity/level of care/duration of stay  If discussed at [place] of Stay Meetings, dates discussed:    Comments:

## 2014-10-23 NOTE — Progress Notes (Signed)
Patient Name: Alan Walker Date of Encounter: 10/23/2014  Principal Problem:   ST elevation (STEMI) myocardial infarction involving left anterior descending coronary artery Active Problems:   Dyslipidemia (high LDL; low HDL)   Hypertension   Primary Cardiologist: Dr. Burt Knack  Patient Profile: 54 yo male w/ no previous CAD, hx HTN, admitted 01/29 w/ STEMI 2nd spont dissection distal LAD,   SUBJECTIVE: No chest pain or SOB  OBJECTIVE Filed Vitals:   10/22/14 1113 10/22/14 1533 10/22/14 2030 10/23/14 0503  BP: 125/73 121/81 99/60 100/63  Pulse: 75 89 68 82  Temp: 97.7 F (36.5 C) 98.1 F (36.7 C) 98.1 F (36.7 C) 98.4 F (36.9 C)  TempSrc: Oral Oral Oral Oral  Resp:  20 18 18   Height:      Weight:    211 lb 11.2 oz (96.026 kg)  SpO2: 96% 98% 99% 99%    Intake/Output Summary (Last 24 hours) at 10/23/14 0929 Last data filed at 10/23/14 0505  Gross per 24 hour  Intake    730 ml  Output    350 ml  Net    380 ml   Filed Weights   10/20/14 1748 10/20/14 2000 10/23/14 0503  Weight: 218 lb 0.6 oz (98.9 kg) 218 lb 0.6 oz (98.9 kg) 211 lb 11.2 oz (96.026 kg)    PHYSICAL EXAM General: Well developed, well nourished, male in no acute distress. Head: Normocephalic, atraumatic.  Neck: Supple without bruits, JVD not elevated. Lungs:  Resp regular and unlabored, CTA. Heart: RRR, S1, S2, no S3, S4, or murmur; no rub. Abdomen: Soft, non-tender, non-distended, BS + x 4.  Extremities: No clubbing, cyanosis, no edema.  Neuro: Alert and oriented X 3. Moves all extremities spontaneously. Psych: Normal affect.  LABS: CBC:  Recent Labs  10/20/14 1609 10/21/14 0335  WBC 7.4 7.9  HGB 13.3  12.0* 12.3*  HCT 39.0  35.8* 36.9*  MCV 90.6 93.9  PLT 246 297   INR:  Recent Labs  10/20/14 1609  INR 4.74   Basic Metabolic Panel:  Recent Labs  10/20/14 1609 10/21/14 0335  NA 142  139 135  K 2.9*  2.7* 3.9  CL 107  107 105  CO2 26 27  GLUCOSE 156*  152*  118*  BUN 16  15 13   CREATININE 1.00  1.08 0.95  CALCIUM 8.4 8.9   Liver Function Tests:  Recent Labs  10/20/14 1609  AST 34  ALT 37  ALKPHOS 50  BILITOT 0.6  PROT 7.4  ALBUMIN 3.8   Cardiac Enzymes:  Recent Labs  10/20/14 1609 10/20/14 2124 10/21/14 0335 10/21/14 1620  CKTOTAL 395*  --   --   --   CKMB 9.1*  --   --   --   TROPONINI 0.26* 53.34* 56.16* 24.12*   Hemoglobin A1C:  Recent Labs  10/20/14 1609  HGBA1C 5.5   Fasting Lipid Panel:  Recent Labs  10/21/14 0335  CHOL 191  HDL 47  LDLCALC 137*  TRIG 33  CHOLHDL 4.1   Lab Results  Component Value Date   HGBA1C 5.5 10/20/2014   TELE:  Echo:  10/22/2014 Study Conclusions - Left ventricle: The cavity size was normal. Wall thickness was normal. Systolic function was normal. The estimated ejection fraction was in the range of 55% to 60%. Although no diagnostic regional wall motion abnormality was identified, this possibility cannot be completely excluded on the basis of this study. Doppler parameters are consistent with abnormal left ventricular  relaxation (grade 1 diastolic dysfunction). - Aortic valve: There was trivial regurgitation. Valve area (Vmax): 3.25 cm^2.  Cardiac Cath: 10/20/2014 Coronary angiography: Coronary dominance: Codominant Left mainstem: Widely patent with no obstruction. Widely patent vessel arising from the left coronary cusp. Left anterior descending (LAD): The proximal LAD is widely patent. The vessel shows no evidence of obstruction. The first 2 diagonals are widely patent with no obstruction. In the mid LAD, the vessel tapers abruptly in an area of tortuosity. It terminates in the distal anterior wall and I suspect the vessel is totally occluded in that area. The vessel has a very typical appearance of a spontaneous coronary artery dissection with intramural hematoma present. Left circumflex (LCx): Large-caliber vessel, smooth throughout its course. There  is no obstructive disease noted. The vessel supplies a small first OM and a large second obtuse marginal branch Right coronary artery (RCA): Small, nondominant or potentially codominant vessel without obstruction. The vessel is smooth throughout its course. Left ventriculography: The inferoapex is akinetic. The other LV wall segments are hyperdynamic with an estimated LVEF of 50-55%. Estimated Blood Loss: Minimal Final Conclusions:  1. Distal LAD occlusion with typical angiographic appearance of spontaneous coronary artery dissection 2. No other evidence of atherosclerotic or obstructive coronary artery disease 3. Mild segmental contraction abnormality of the left ventricle consistent with inferoapical myocardial infarction Recommendations: I think this patient has had spontaneous coronary artery dissection. He has no risk factors for this, but his angiogram has a classic appearance. He is now stable with no chest pain. Based on the EKG injury current and wall motion abnormality, I suspect his LAD wrapped around the LV apex and supplied the inferoapical wall. He will be treated medically. Would avoid anticoagulation or dual antiplatelet therapy based on concern over extension of intramural hematoma in the LAD. Would treat with aspirin alone. Will check a lipid panel, and would only start a statin drug if he has significant hyperlipidemia. There apparently is some data that statin drugs may be detrimental in patients with spontaneous coronary dissection. The patient will be transferred to the CCU where he should remain an inpatient for at least another 48 hours.   Current Medications:  . aspirin EC  81 mg Oral Daily  . atorvastatin  20 mg Oral q1800  . carvedilol  12.5 mg Oral BID WC  . losartan  25 mg Oral Daily  . potassium chloride  20 mEq Oral BID      ASSESSMENT AND PLAN: Principal Problem:   ST elevation (STEMI) myocardial infarction involving left anterior descending coronary artery --  2nd dissection -- on ASA/BB, statin is low dose due to dissection  Active Problems:   Dyslipidemia (high LDL; low HDL) -- low-dose statin added    Hypertension -- no meds PTA, now on BB, SBP 90-100s at times, but no doses held.     Hypokalemia --  K+ 2.7 on admission, supplemented and improved  Plan - d/c today, continue current rx, believe BP will run a little higher once he is home and more active. Pt wants to return to work in 2 weeks, will get TCM office visit, OK to return to work (no lifting over 25 lbs) if continues to do well. Pt will f/u with cardiac rehab, defer home exercise program until more recovered and has been to cardiac rehab.  SignedRosaria Ferries , PA-C 9:29 AM 10/23/2014    Patient seen and examined. Agree with assessment and plan. Feels well. No recurrent chest pain. Hypokalemic on admission.  Currently on low dose losartan, carvedilol and atorvastatin. If hypokalemia develops on therapy consider eval for hyperaldosterone.  For dc today; ov f/u next week prior to returning to work.   Troy Sine, MD, [facility] 10/23/2014 10:08 AM

## 2014-10-25 ENCOUNTER — Telehealth: Payer: Self-pay | Admitting: Cardiovascular Disease

## 2014-10-25 NOTE — Telephone Encounter (Signed)
New Message  Pt sched for TCM- 2/8 w/ SCott Weaver @ 12:10.

## 2014-10-25 NOTE — Telephone Encounter (Signed)
Patient contacted regarding discharge from [facility] on October 23, 2014.  Patient understands to follow up with provider Richardson Dopp, PA-C on 10/30/14 at 12:10PM at [facility]. location. Patient understands discharge instructions? yes Patient understands medications and regiment? yes Patient understands to bring all medications to this visit? yes

## 2014-10-30 ENCOUNTER — Ambulatory Visit (INDEPENDENT_AMBULATORY_CARE_PROVIDER_SITE_OTHER): Payer: BLUE CROSS/BLUE SHIELD | Admitting: Physician Assistant

## 2014-10-30 ENCOUNTER — Encounter: Payer: Self-pay | Admitting: Physician Assistant

## 2014-10-30 VITALS — BP 120/80 | HR 66 | Ht 69.0 in | Wt 221.0 lb

## 2014-10-30 DIAGNOSIS — E784 Other hyperlipidemia: Secondary | ICD-10-CM

## 2014-10-30 DIAGNOSIS — E785 Hyperlipidemia, unspecified: Secondary | ICD-10-CM

## 2014-10-30 DIAGNOSIS — I10 Essential (primary) hypertension: Secondary | ICD-10-CM

## 2014-10-30 DIAGNOSIS — I252 Old myocardial infarction: Secondary | ICD-10-CM

## 2014-10-30 DIAGNOSIS — E876 Hypokalemia: Secondary | ICD-10-CM

## 2014-10-30 DIAGNOSIS — I2542 Coronary artery dissection: Secondary | ICD-10-CM

## 2014-10-30 LAB — BASIC METABOLIC PANEL
BUN: 11 mg/dL (ref 6–23)
CO2: 30 mEq/L (ref 19–32)
Calcium: 9.1 mg/dL (ref 8.4–10.5)
Chloride: 103 mEq/L (ref 96–112)
Creatinine, Ser: 0.97 mg/dL (ref 0.40–1.50)
GFR: 104.01 mL/min (ref >60.00)
Glucose, Bld: 87 mg/dL (ref 70–99)
Potassium: 4.6 mEq/L (ref 3.5–5.1)
Sodium: 137 mEq/L (ref 135–145)

## 2014-10-30 NOTE — Patient Instructions (Signed)
Continue your current medications.  We will make a referral to Cardiac Rehabilitation for you.  They should call you in the next couple of weeks.  Labs today:  BMET.  Labs in 4-6 weeks:  Lipid Panel, Hepatic Function Panel.  Schedule follow up with Dr. Sherren Mocha in 4-6 weeks.

## 2014-10-30 NOTE — Progress Notes (Signed)
Cardiology Office Note   Date:  10/30/2014   ID:  Alan Walker, DOB 30-Jun-1961, MRN [id number]  PCP:  No primary care provider on file.  Cardiologist:  Dr. Sherren Mocha     Chief Complaint  Patient presents with  . Hospitalization Follow-up    s/p Inf STEMI 2/2 LAD dissection  . Coronary Artery Dissection of the LAD  . Hypertension     History of Present Illness: Alan Walker is a 54 y.o. male with a hx of HTN.  He was admitted 1/29-2/1 with inferior STEMI c/b VF upon arrival to the cath lab which was treated with defib.  LHC demonstrated distal LAD occlusion with typical appearance of spontaneous coronary artery dissection and mild segmental contraction abnormality of LV c/w inferoapical MI.  Medical Rx was recommended.  ASA alone was recommended to avoid extension of intramural hematoma in the LAD.  He was started on beta blocker, ARB, statin.  He required K+ supplementation for hypokalemia.  ARB was held 2/2 low BP at DC.   He is doing well.  He denies chest pain, shortness of breath, syncope, orthopnea, PND or significant pedal edema.  He is interested in Cardiac Rehab.     Studies/Reports Reviewed Today:   Cardiac Catheterization 10/20/14 LAD:  Mid LAD tapers abruptly in an area of tortuosity. It terminates in the distal anterior wall and is suspected to be totally occluded in that area - Typical appearance of a spontaneous coronary artery dissection with intramural hematoma present. EF 50-55%, Inferoapex is akinetic.   Echocardiogram 10/22/14 - Left ventricle:  EF 55% to 60%. Cannot r/o WMA. Grade 1 diastolic dysfunction - Aortic valve: There was trivial regurgitation.     Past Medical History  Diagnosis Date  . Hypertension   . ST elevation (STEMI) myocardial infarction involving left anterior descending coronary artery 10/20/2014    a.  LHC 10/20/14:  LAD:  Mid LAD tapers abruptly in an area of tortuosity. It terminates in the distal anterior wall and is  suspected to be totally occluded in that area - Typical appearance of a spontaneous coronary artery dissection with intramural hematoma present. EF 50-55% with inf-apical AK;  b. Echo 10/22/14:  EF 55-60%, Gr 1 DD  . Dyslipidemia (high LDL; low HDL) 10/20/2014  . Dissection of coronary artery 10/20/2014    Distal LAD  . Ventricular fibrillation 10/20/2014    in the setting of acute STEMI    Past Surgical History  Procedure Laterality Date  . Left heart catheterization with coronary angiogram N/A 10/20/2014    Procedure: LEFT HEART CATHETERIZATION WITH CORONARY ANGIOGRAM;  Surgeon: Blane Ohara, MD; L main OK, LAD 100% distal (dissection), CFX 0%, RCA 0%, EF 50-55%      Current Outpatient Prescriptions  Medication Sig Dispense Refill  . aspirin 81 MG tablet Take 1 tablet (81 mg total) by mouth daily.    Marland Kitchen atorvastatin (LIPITOR) 20 MG tablet Take 1 tablet (20 mg total) by mouth daily at 6 PM. 30 tablet 11  . carvedilol (COREG) 12.5 MG tablet Take 1 tablet (12.5 mg total) by mouth 2 (two) times daily with a meal. 60 tablet 11  . nitroGLYCERIN (NITROSTAT) 0.4 MG SL tablet Place 1 tablet (0.4 mg total) under the tongue every 5 (five) minutes as needed for chest pain. 25 tablet 3   No current facility-administered medications for this visit.    Allergies:   Review of patient's allergies indicates no known allergies.    Social  History:  The patient  reports that he has never smoked. He does not have any smokeless tobacco history on file. He reports that he does not drink alcohol or use illicit drugs.   Family History:  The patient's family history includes Asthma in his mother and son; Hypertension in his mother; Stroke in his brother. There is no history of Heart attack.    ROS:  Please see the history of present illness.   Otherwise, review of systems are positive for none.   All other systems are reviewed and negative.    PHYSICAL EXAM: VS:  BP 120/80 mmHg  Pulse 66  Ht 5\' 9"   (1.753 m)  Wt 221 lb (100.245 kg)  BMI 32.62 kg/m2    Wt Readings from Last 3 Encounters:  10/30/14 221 lb (100.245 kg)  10/23/14 211 lb 11.2 oz (96.026 kg)  09/16/14 218 lb (98.884 kg)     GEN: Well nourished, well developed, in no acute distress HEENT: normal Neck: no JVD, no masses Cardiac:  Normal S1/S2, RRR; no murmur, no rubs or gallops, no edema, right wrist without hematoma or mass   Respiratory:  clear to auscultation bilaterally, no wheezing, rhonchi or rales. GI: soft, nontender, nondistended, + BS MS: no deformity or atrophy Skin: warm and dry  Neuro:  CNs II-XII intact, Strength and sensation are intact Psych: Normal affect   EKG:  EKG is ordered today.  It demonstrates:   NSR, HR 66, inf Q waves, TWI in 2, aVF, V1-5   Recent Labs: 10/20/2014: ALT 37; B Natriuretic Peptide 23.7 10/21/2014: BUN 13; Creatinine 0.95; Hemoglobin 12.3*; Platelets 297; Potassium 3.9; Sodium 135    Lipid Panel    Component Value Date/Time   CHOL 191 10/21/2014 0335   TRIG 33 10/21/2014 0335   HDL 47 10/21/2014 0335   CHOLHDL 4.1 10/21/2014 0335   VLDL 7 10/21/2014 0335   LDLCALC 137* 10/21/2014 0335      ASSESSMENT AND PLAN:  1.  Inferoapical STEMI 2/2 Distal LAD Dissection:  He is doing well after recent STEMI caused by distal LAD dissection.  He is tolerating Coreg, ASA, Lipitor.  BP was too soft to tolerate ARB in the hospital.  I am not certain he could tolerate it now.  I have asked him to monitor his BP.  If it is running higher at his next visit, we could consider restarting the Losartan.  Refer to Cardiac Rehab.  He does a lot of lifting at his job.  We can reassess him at FU to determine when he should return.   2.  Hypokalemia:  Check BMET today. 3.  Hypertension:  At target with Coreg 12.5 mg bid. 4.  Hyperlipidemia:  Check Lipids and LFTs in 6 weeks. Continue Lipitor 20 QD.   Current medicines are reviewed at length with the patient today.  The patient does not have  concerns regarding medicines.  The following changes have been made:  no change  Labs/ tests ordered today include:   Orders Placed This Encounter  Procedures  . Hepatic function panel  . Lipid panel  . Basic metabolic panel  . AMB referral to cardiac rehabilitation  . EKG 12-Lead     Disposition:   FU with Dr. Sherren Mocha in 4 weeks   Signed, Versie Starks, MHS 10/30/2014 12:16 PM    [address], [place],   [address] Phone: [phone]; Fax: [phone]

## 2014-10-31 ENCOUNTER — Telehealth: Payer: Self-pay | Admitting: *Deleted

## 2014-10-31 NOTE — Telephone Encounter (Signed)
pt notified about lab results with verbal understanding  

## 2014-11-13 ENCOUNTER — Telehealth: Payer: Self-pay | Admitting: Cardiovascular Disease

## 2014-11-13 NOTE — Telephone Encounter (Signed)
lmtcb

## 2014-11-13 NOTE — Telephone Encounter (Signed)
New message      Pt need Dr Burt Knack to complete the FMLA papers ASAP so that he can get paid.  Please call and give status on papers.  He dropped them off over a week ago.

## 2014-11-13 NOTE — Telephone Encounter (Signed)
Calling stating he left some FMLA papers a week ago and needs them sent to his employer in order for him to get paid.  Advised Dr. Burt Knack not in office today and will forward message to him and his nurse to get them signed ASAP. Advised that someone will call him when papers have been signed.

## 2014-11-14 NOTE — Telephone Encounter (Signed)
Dr Burt Knack completed paperwork today.  Forms have been returned to medical records.

## 2014-11-24 ENCOUNTER — Other Ambulatory Visit (INDEPENDENT_AMBULATORY_CARE_PROVIDER_SITE_OTHER): Payer: BLUE CROSS/BLUE SHIELD | Admitting: *Deleted

## 2014-11-24 DIAGNOSIS — E784 Other hyperlipidemia: Secondary | ICD-10-CM

## 2014-11-24 DIAGNOSIS — E785 Hyperlipidemia, unspecified: Secondary | ICD-10-CM

## 2014-11-24 DIAGNOSIS — E876 Hypokalemia: Secondary | ICD-10-CM

## 2014-11-24 DIAGNOSIS — I2542 Coronary artery dissection: Secondary | ICD-10-CM

## 2014-11-24 LAB — LIPID PANEL
Cholesterol: 136 mg/dL (ref 0–200)
HDL: 43.4 mg/dL (ref >39.00)
LDL Cholesterol: 76 mg/dL (ref 0–99)
NonHDL: 92.6
Total CHOL/HDL Ratio: 3
Triglycerides: 83 mg/dL (ref 0.0–149.0)
VLDL: 16.6 mg/dL (ref 0.0–40.0)

## 2014-11-24 LAB — BASIC METABOLIC PANEL
BUN: 12 mg/dL (ref 6–23)
CO2: 33 mEq/L — ABNORMAL HIGH (ref 19–32)
Calcium: 9.3 mg/dL (ref 8.4–10.5)
Chloride: 102 mEq/L (ref 96–112)
Creatinine, Ser: 1.05 mg/dL (ref 0.40–1.50)
GFR: 94.9 mL/min (ref >60.00)
Glucose, Bld: 97 mg/dL (ref 70–99)
Potassium: 4.5 mEq/L (ref 3.5–5.1)
Sodium: 138 mEq/L (ref 135–145)

## 2014-11-24 LAB — HEPATIC FUNCTION PANEL
ALT: 26 U/L (ref 0–53)
AST: 19 U/L (ref 0–37)
Albumin: 4.2 g/dL (ref 3.5–5.2)
Alkaline Phosphatase: 51 U/L (ref 39–117)
Bilirubin, Direct: 0.1 mg/dL (ref 0.0–0.3)
Total Bilirubin: 0.6 mg/dL (ref 0.2–1.2)
Total Protein: 8.1 g/dL (ref 6.0–8.3)

## 2014-11-24 NOTE — Addendum Note (Signed)
Addended by: Eulis Foster on: 11/24/2014 08:35 AM   Modules accepted: Orders

## 2014-11-27 ENCOUNTER — Telehealth: Payer: Self-pay | Admitting: *Deleted

## 2014-11-27 NOTE — Telephone Encounter (Signed)
pt notified about lab results with verbal understanding. pt asked did he need to stay on the lipitor I advised yes. He also asked did lipitor cause type II diabetes. I stated I had not heard that however; to ask Dr. Burt Knack at appt 3/8, pt said ok.

## 2014-11-28 ENCOUNTER — Ambulatory Visit (INDEPENDENT_AMBULATORY_CARE_PROVIDER_SITE_OTHER): Payer: BLUE CROSS/BLUE SHIELD | Admitting: Cardiovascular Disease

## 2014-11-28 ENCOUNTER — Encounter: Payer: Self-pay | Admitting: Cardiovascular Disease

## 2014-11-28 VITALS — BP 128/84 | HR 83 | Ht 69.0 in | Wt 223.8 lb

## 2014-11-28 DIAGNOSIS — I2510 Atherosclerotic heart disease of native coronary artery without angina pectoris: Secondary | ICD-10-CM

## 2014-11-28 NOTE — Progress Notes (Signed)
Cardiology Office Note   Date:  11/28/2014   ID:  Alan Walker, DOB [birth year], MRN [id number]  PCP:  No primary care provider on file.  Cardiologist:  Sherren Mocha, MD    Chief Complaint  Patient presents with  . Coronary Artery Disease     History of Present Illness: Alan Walker is a 54 y.o. male who presents for follow-up after recent MI. He was admitted January 29 with an inferior STEMI, gated by ventricular fibrillation. He underwent emergency coronary angiography which demonstrated typical findings of spontaneous coronary artery dissection of the mid and distal LAD. The patient was treated medically. His LV EF was preserved based on echo findings with an ejection fraction of 60%.  He's concerned because he 'can't hear his heartbeat anymore.' He also feels occasional palpitations. Otherwise states 'I feel great.' He is walking around the house and no symptoms with light exertion. No chest pain or pressure. No shortness of breath with activity. No leg swelling.  Past Medical History  Diagnosis Date  . Hypertension   . ST elevation (STEMI) myocardial infarction involving left anterior descending coronary artery 10/20/2014    a.  LHC 10/20/14:  LAD:  Mid LAD tapers abruptly in an area of tortuosity. It terminates in the distal anterior wall and is suspected to be totally occluded in that area - Typical appearance of a spontaneous coronary artery dissection with intramural hematoma present. EF 50-55% with inf-apical AK;  b. Echo 10/22/14:  EF 55-60%, Gr 1 DD  . Dyslipidemia (high LDL; low HDL) 10/20/2014  . Dissection of coronary artery 10/20/2014    Distal LAD  . Ventricular fibrillation 10/20/2014    in the setting of acute STEMI    Past Surgical History  Procedure Laterality Date  . Left heart catheterization with coronary angiogram N/A 10/20/2014    Procedure: LEFT HEART CATHETERIZATION WITH CORONARY ANGIOGRAM;  Surgeon: Blane Ohara, MD; L main [place], LAD 100%  distal (dissection), CFX 0%, RCA 0%, EF 50-55%     Current Outpatient Prescriptions  Medication Sig Dispense Refill  . aspirin 81 MG tablet Take 1 tablet (81 mg total) by mouth daily.    Marland Kitchen atorvastatin (LIPITOR) 20 MG tablet Take 1 tablet (20 mg total) by mouth daily at 6 PM. 30 tablet 11  . carvedilol (COREG) 12.5 MG tablet Take 1 tablet (12.5 mg total) by mouth 2 (two) times daily with a meal. 60 tablet 11  . nitroGLYCERIN (NITROSTAT) 0.4 MG SL tablet Place 1 tablet (0.4 mg total) under the tongue every 5 (five) minutes as needed for chest pain. 25 tablet 3   No current facility-administered medications for this visit.    Allergies:   Review of patient's allergies indicates no known allergies.   Social History:  The patient  reports that he has never smoked. He does not have any smokeless tobacco history on file. He reports that he does not drink alcohol or use illicit drugs.   Family History:  The patient's family history includes Asthma in his mother and son; Hypertension in his mother; Stroke in his brother. There is no history of Heart attack.    ROS:  Please see the history of present illness.  All other systems are reviewed and negative.   PHYSICAL EXAM: VS:  BP 128/84 mmHg  Pulse 83  Ht 5\' 9"  (1.753 m)  Wt 223 lb 12.8 oz (101.515 kg)  BMI 33.03 kg/m2  SpO2 97% , BMI Body mass index is 33.03 kg/(m^2).  GEN: Well nourished, well developed, in no acute distress HEENT: normal Neck: no JVD, no masses, no carotid bruits Cardiac: RRR without murmur or gallop                Respiratory:  clear to auscultation bilaterally, normal work of breathing GI: soft, nontender, nondistended, + BS MS: no deformity or atrophy Ext: no pretibial edema Skin: warm and dry, no rash Neuro:  Strength and sensation are intact Psych: euthymic mood, full affect  EKG:  EKG is not ordered today.  Recent Labs: 10/20/2014: B Natriuretic Peptide 23.7 10/21/2014: Hemoglobin 12.3*; Platelets  297 11/24/2014: ALT 26; BUN 12; Creatinine 1.05; Potassium 4.5; Sodium 138   Lipid Panel     Component Value Date/Time   CHOL 136 11/24/2014 0835   TRIG 83.0 11/24/2014 0835   HDL 43.40 11/24/2014 0835   CHOLHDL 3 11/24/2014 0835   VLDL 16.6 11/24/2014 0835   LDLCALC 76 11/24/2014 0835      Wt Readings from Last 3 Encounters:  11/28/14 223 lb 12.8 oz (101.515 kg)  10/30/14 221 lb (100.245 kg)  10/23/14 211 lb 11.2 oz (96.026 kg)     Cardiac Studies Reviewed: 2D Echo 10/22/2014: Study Conclusions  - Left ventricle: The cavity size was normal. Wall thickness was normal. Systolic function was normal. The estimated ejection fraction was in the range of 55% to 60%. Although no diagnostic regional wall motion abnormality was identified, this possibility cannot be completely excluded on the basis of this study. Doppler parameters are consistent with abnormal left ventricular relaxation (grade 1 diastolic dysfunction). - Aortic valve: There was trivial regurgitation. Valve area (Vmax): 3.25 cm^2.  ASSESSMENT AND PLAN: 1.  CAD, native vessel, without symptoms of angina. The patient had an acute MI proximally 6 weeks ago secondary to spontaneous coronary artery dissection. He is tolerating medical therapy as outlined above. He will start cardiac rehabilitation next week. He would like to return to work full time in about one month which I think is an appropriate timeframe after his acute infarction. He does have to do some heavy physical exertion with his work.  2. Essential hypertension: Blood pressure well controlled on carvedilol. Continue the same.  3. Hyperlipidemia: Recent lipids reviewed with an excellent response to atorvastatin. Will continue the same. LFTs are within normal limits.   Current medicines are reviewed with the patient today.  The patient does not have concerns regarding medicines.  The following changes have been made:  no change  Labs/ tests  ordered today include:  No orders of the defined types were placed in this encounter.    Disposition:   FU 1 month with Richardson Dopp, I will see back in 6 months. Anticipate RTN to work in 4-6 weeks depending on his symptoms.  Signed, Sherren Mocha, MD  11/28/2014 2:13 PM    [facility] [address], [place], [place]  [address] Phone: [phone]; Fax: [phone]

## 2014-11-28 NOTE — Patient Instructions (Signed)
Your physician recommends that you schedule a follow-up appointment in: 1 MONTH with Richardson Dopp PA-C  Your physician wants you to follow-up in: 6 MONTHS with Dr Burt Knack.  You will receive a reminder letter in the mail two months in advance. If you don't receive a letter, please call our office to schedule the follow-up appointment.  Your physician recommends that you continue on your current medications as directed. Please refer to the Current Medication list given to you today.

## 2014-11-30 ENCOUNTER — Encounter (HOSPITAL_COMMUNITY)
Admission: RE | Admit: 2014-11-30 | Discharge: 2014-11-30 | Disposition: A | Discharge status: Home / Self Care | Payer: BLUE CROSS/BLUE SHIELD | Source: Ambulatory Visit | Attending: Cardiovascular Disease | Admitting: Cardiovascular Disease

## 2014-11-30 DIAGNOSIS — E785 Hyperlipidemia, unspecified: Secondary | ICD-10-CM | POA: Insufficient documentation

## 2014-11-30 DIAGNOSIS — I10 Essential (primary) hypertension: Secondary | ICD-10-CM | POA: Insufficient documentation

## 2014-11-30 DIAGNOSIS — E876 Hypokalemia: Secondary | ICD-10-CM | POA: Insufficient documentation

## 2014-11-30 DIAGNOSIS — Z8249 Family history of ischemic heart disease and other diseases of the circulatory system: Secondary | ICD-10-CM | POA: Insufficient documentation

## 2014-11-30 DIAGNOSIS — I2542 Coronary artery dissection: Secondary | ICD-10-CM | POA: Insufficient documentation

## 2014-11-30 DIAGNOSIS — I252 Old myocardial infarction: Secondary | ICD-10-CM | POA: Insufficient documentation

## 2014-11-30 DIAGNOSIS — Z5189 Encounter for other specified aftercare: Secondary | ICD-10-CM | POA: Insufficient documentation

## 2014-11-30 NOTE — Progress Notes (Signed)
Cardiac Rehab Medication Review by a Pharmacist  Does the patient  feel that his/her medications are working for him/her?  yes  Has the patient been experiencing any side effects to the medications prescribed?  no  Does the patient measure his/her own blood pressure or blood glucose at home?  no   Does the patient have any problems obtaining medications due to transportation or finances?   no  Understanding of regimen: excellent Understanding of indications: fair Potential of compliance: good    Pharmacist comments: 54yo M presenting to cardiac rehab orientation in great spirits. Patient verbalizes understanding of medication regimen and explains how he is taking each. All questions were answered.   Thank you for allowing pharmacy to be part of this patient's care team  Gianfranco Araki M. Shenaya Lebo, Pharm.D Clinical Pharmacy Resident Pager: [phone] 11/30/2014 .9:05 AM

## 2014-12-04 ENCOUNTER — Encounter (HOSPITAL_COMMUNITY)
Admission: RE | Admit: 2014-12-04 | Discharge: 2014-12-04 | Disposition: A | Discharge status: Home / Self Care | Payer: BLUE CROSS/BLUE SHIELD | Source: Ambulatory Visit | Attending: Cardiovascular Disease | Admitting: Cardiovascular Disease

## 2014-12-04 ENCOUNTER — Encounter (HOSPITAL_COMMUNITY): Payer: Self-pay

## 2014-12-04 DIAGNOSIS — I10 Essential (primary) hypertension: Secondary | ICD-10-CM | POA: Diagnosis not present

## 2014-12-04 DIAGNOSIS — I252 Old myocardial infarction: Secondary | ICD-10-CM | POA: Diagnosis not present

## 2014-12-04 DIAGNOSIS — Z5189 Encounter for other specified aftercare: Secondary | ICD-10-CM | POA: Diagnosis present

## 2014-12-04 DIAGNOSIS — E785 Hyperlipidemia, unspecified: Secondary | ICD-10-CM | POA: Diagnosis not present

## 2014-12-04 DIAGNOSIS — I2542 Coronary artery dissection: Secondary | ICD-10-CM | POA: Diagnosis not present

## 2014-12-04 DIAGNOSIS — Z8249 Family history of ischemic heart disease and other diseases of the circulatory system: Secondary | ICD-10-CM | POA: Diagnosis not present

## 2014-12-04 DIAGNOSIS — E876 Hypokalemia: Secondary | ICD-10-CM | POA: Diagnosis not present

## 2014-12-04 NOTE — Progress Notes (Signed)
Pt started cardiac rehab today.  Pt tolerated light exercise without difficulty.  VSS,telemetry-sinus rhythm, t wave inversion.  Asymptomatic.  PHQ-0. Pt has no barriers to rehab participation. Pt has good coping skills, positive outlook and family support.  Pt enjoys running and would like to participate in 5K scheduled for December. Pt goals for cardiac rehab are to improve his heart health and be able to participate in 5K again.  Pt oriented to exercise equipment and routine.  Understanding verbalized.

## 2014-12-06 ENCOUNTER — Encounter (HOSPITAL_COMMUNITY)
Admission: RE | Admit: 2014-12-06 | Discharge: 2014-12-06 | Disposition: A | Discharge status: Home / Self Care | Payer: BLUE CROSS/BLUE SHIELD | Source: Ambulatory Visit | Attending: Cardiovascular Disease | Admitting: Cardiovascular Disease

## 2014-12-06 DIAGNOSIS — Z5189 Encounter for other specified aftercare: Secondary | ICD-10-CM | POA: Diagnosis not present

## 2014-12-06 NOTE — Progress Notes (Signed)
PSYCHOSOCIAL ASSESSMENT  Pt psychosocial assessment reveals no barriers to rehab participation.  Pt is pleased that his symptoms are relieved with recent cardiac intervention. Pt has questions about palpitations, which were answered.  Pt verbalized understanding and reassurance.    Pt exhibits positive coping skills and has supportive family.  Offered emotional support and reassurance.  Will continue to monitor.

## 2014-12-08 ENCOUNTER — Encounter (HOSPITAL_COMMUNITY)
Admission: RE | Admit: 2014-12-08 | Discharge: 2014-12-08 | Disposition: A | Discharge status: Home / Self Care | Payer: BLUE CROSS/BLUE SHIELD | Source: Ambulatory Visit | Attending: Cardiovascular Disease | Admitting: Cardiovascular Disease

## 2014-12-08 DIAGNOSIS — Z5189 Encounter for other specified aftercare: Secondary | ICD-10-CM | POA: Diagnosis not present

## 2014-12-11 ENCOUNTER — Encounter (HOSPITAL_COMMUNITY)
Admission: RE | Admit: 2014-12-11 | Discharge: 2014-12-11 | Disposition: A | Discharge status: Home / Self Care | Payer: BLUE CROSS/BLUE SHIELD | Source: Ambulatory Visit | Attending: Cardiovascular Disease | Admitting: Cardiovascular Disease

## 2014-12-11 DIAGNOSIS — Z5189 Encounter for other specified aftercare: Secondary | ICD-10-CM | POA: Diagnosis not present

## 2014-12-11 NOTE — Progress Notes (Addendum)
Reviewed home exercise guidelines with patient including endpoints, temperature precautions, target heart rate and rate of perceived exertion. Pt states he is walking/jogging 20 minutes 3s/week and lifting approx 50 lbs as his mode of home exercise. Pt was advised that he needed physician clearance for weight training and jogging. Will send request to Dr. Burt Knack, pt's cardiologist for approval for weights and jogging. Pt was previously active prior to his MI, and goal is to return to exercise at fitness center. Pt voices understanding of instructions given.

## 2014-12-13 ENCOUNTER — Encounter (HOSPITAL_COMMUNITY)
Admission: RE | Admit: 2014-12-13 | Discharge: 2014-12-13 | Disposition: A | Discharge status: Home / Self Care | Payer: BLUE CROSS/BLUE SHIELD | Source: Ambulatory Visit | Attending: Cardiovascular Disease | Admitting: Cardiovascular Disease

## 2014-12-13 DIAGNOSIS — Z5189 Encounter for other specified aftercare: Secondary | ICD-10-CM | POA: Diagnosis not present

## 2014-12-15 ENCOUNTER — Encounter (HOSPITAL_COMMUNITY)
Admission: RE | Admit: 2014-12-15 | Discharge: 2014-12-15 | Disposition: A | Discharge status: Home / Self Care | Payer: BLUE CROSS/BLUE SHIELD | Source: Ambulatory Visit | Attending: Cardiovascular Disease | Admitting: Cardiovascular Disease

## 2014-12-15 DIAGNOSIS — Z5189 Encounter for other specified aftercare: Secondary | ICD-10-CM | POA: Diagnosis not present

## 2014-12-18 ENCOUNTER — Encounter (HOSPITAL_COMMUNITY)
Admission: RE | Admit: 2014-12-18 | Discharge: 2014-12-18 | Disposition: A | Discharge status: Home / Self Care | Payer: BLUE CROSS/BLUE SHIELD | Source: Ambulatory Visit | Attending: Cardiovascular Disease | Admitting: Cardiovascular Disease

## 2014-12-18 DIAGNOSIS — Z5189 Encounter for other specified aftercare: Secondary | ICD-10-CM | POA: Diagnosis not present

## 2014-12-20 ENCOUNTER — Encounter (HOSPITAL_COMMUNITY)
Admission: RE | Admit: 2014-12-20 | Discharge: 2014-12-20 | Disposition: A | Discharge status: Home / Self Care | Payer: BLUE CROSS/BLUE SHIELD | Source: Ambulatory Visit | Attending: Cardiovascular Disease | Admitting: Cardiovascular Disease

## 2014-12-20 DIAGNOSIS — Z5189 Encounter for other specified aftercare: Secondary | ICD-10-CM | POA: Diagnosis not present

## 2014-12-22 ENCOUNTER — Encounter (HOSPITAL_COMMUNITY)
Admission: RE | Admit: 2014-12-22 | Discharge: 2014-12-22 | Disposition: A | Discharge status: Home / Self Care | Payer: BLUE CROSS/BLUE SHIELD | Source: Ambulatory Visit | Attending: Cardiovascular Disease | Admitting: Cardiovascular Disease

## 2014-12-22 DIAGNOSIS — Z8249 Family history of ischemic heart disease and other diseases of the circulatory system: Secondary | ICD-10-CM | POA: Diagnosis not present

## 2014-12-22 DIAGNOSIS — Z5189 Encounter for other specified aftercare: Secondary | ICD-10-CM | POA: Insufficient documentation

## 2014-12-22 DIAGNOSIS — I2542 Coronary artery dissection: Secondary | ICD-10-CM | POA: Insufficient documentation

## 2014-12-22 DIAGNOSIS — I10 Essential (primary) hypertension: Secondary | ICD-10-CM | POA: Diagnosis not present

## 2014-12-22 DIAGNOSIS — I252 Old myocardial infarction: Secondary | ICD-10-CM | POA: Insufficient documentation

## 2014-12-22 DIAGNOSIS — E785 Hyperlipidemia, unspecified: Secondary | ICD-10-CM | POA: Insufficient documentation

## 2014-12-22 DIAGNOSIS — E876 Hypokalemia: Secondary | ICD-10-CM | POA: Diagnosis not present

## 2014-12-25 ENCOUNTER — Encounter (HOSPITAL_COMMUNITY)
Admission: RE | Admit: 2014-12-25 | Discharge: 2014-12-25 | Disposition: A | Discharge status: Home / Self Care | Payer: BLUE CROSS/BLUE SHIELD | Source: Ambulatory Visit | Attending: Cardiovascular Disease | Admitting: Cardiovascular Disease

## 2014-12-25 DIAGNOSIS — Z5189 Encounter for other specified aftercare: Secondary | ICD-10-CM | POA: Diagnosis not present

## 2014-12-27 ENCOUNTER — Encounter (HOSPITAL_COMMUNITY)
Admission: RE | Admit: 2014-12-27 | Discharge: 2014-12-27 | Disposition: A | Discharge status: Home / Self Care | Payer: BLUE CROSS/BLUE SHIELD | Source: Ambulatory Visit | Attending: Cardiovascular Disease | Admitting: Cardiovascular Disease

## 2014-12-27 DIAGNOSIS — Z5189 Encounter for other specified aftercare: Secondary | ICD-10-CM | POA: Diagnosis not present

## 2014-12-27 NOTE — Progress Notes (Signed)
Alan Walker 54 y.o. male Nutrition Note Spoke with pt.  Nutrition Survey reviewed with pt. Pt is following Step 1 of the Therapeutic Lifestyle Changes diet. Pt wants to lose wt. Wt loss tips reviewed. Pt expressed understanding of the information reviewed. Pt aware of nutrition education classes offered and is unable to attend nutrition classes .  Nutrition Diagnosis ? Food-and nutrition-related knowledge deficit related to lack of exposure to information as related to diagnosis of: ? CVD  ? Obesity related to excessive energy intake as evidenced by a BMI of 32.0  Nutrition Intervention ? Benefits of adopting Therapeutic Lifestyle Changes discussed when Medficts reviewed. ? Pt to attend the Portion Distortion class ? Pt given handouts for: ? Nutrition I class ? Nutrition II class ? Continue client-centered nutrition education by RD, as part of interdisciplinary care.  Goal(s) ? Pt to identify and limit food sources of saturated fat, trans fat, and cholesterol ? Pt to identify food quantities necessary to achieve: ? wt loss to a goal wt of 200-218 lb (90.9-99.1 kg) at graduation from cardiac rehab.   Monitor and Evaluate progress toward nutrition goal with team.  Derek Mound, M.Ed, RD, LDN, CDE 12/27/2014 9:21 AM

## 2014-12-29 ENCOUNTER — Encounter (HOSPITAL_COMMUNITY)
Admission: RE | Admit: 2014-12-29 | Discharge: 2014-12-29 | Disposition: A | Discharge status: Home / Self Care | Payer: BLUE CROSS/BLUE SHIELD | Source: Ambulatory Visit | Attending: Cardiovascular Disease | Admitting: Cardiovascular Disease

## 2014-12-29 DIAGNOSIS — Z5189 Encounter for other specified aftercare: Secondary | ICD-10-CM | POA: Diagnosis not present

## 2015-01-01 ENCOUNTER — Encounter (HOSPITAL_COMMUNITY)
Admission: RE | Admit: 2015-01-01 | Discharge: 2015-01-01 | Disposition: A | Discharge status: Home / Self Care | Payer: BLUE CROSS/BLUE SHIELD | Source: Ambulatory Visit | Attending: Cardiovascular Disease | Admitting: Cardiovascular Disease

## 2015-01-01 DIAGNOSIS — Z5189 Encounter for other specified aftercare: Secondary | ICD-10-CM | POA: Diagnosis not present

## 2015-01-03 ENCOUNTER — Encounter (HOSPITAL_COMMUNITY)
Admission: RE | Admit: 2015-01-03 | Discharge: 2015-01-03 | Disposition: A | Discharge status: Home / Self Care | Payer: BLUE CROSS/BLUE SHIELD | Source: Ambulatory Visit | Attending: Cardiovascular Disease | Admitting: Cardiovascular Disease

## 2015-01-03 ENCOUNTER — Telehealth: Payer: Self-pay | Admitting: Cardiovascular Disease

## 2015-01-03 DIAGNOSIS — Z5189 Encounter for other specified aftercare: Secondary | ICD-10-CM | POA: Diagnosis not present

## 2015-01-03 NOTE — Telephone Encounter (Signed)
I spoke with Alan Walker and made aware that the pt does not require SBE and does not need to hold any medications prior to extraction.

## 2015-01-03 NOTE — Progress Notes (Signed)
Cardiology Office Note   Date:  01/04/2015   ID:  Alan Walker, DOB 1961/06/23, MRN [id number]  PCP:  No PCP Per Patient  Cardiologist:  Dr. Sherren Mocha     Chief Complaint  Patient presents with  . Coronary Artery Disease     History of Present Illness: Alan Walker is a 54 y.o. male with a hx of HTN.  He was admitted 1/29-2/1 with inferior STEMI c/b VF upon arrival to the cath lab which was treated with defib.  LHC demonstrated distal LAD occlusion with typical appearance of spontaneous coronary artery dissection and mild segmental contraction abnormality of LV c/w inferoapical MI.  Medical Rx was recommended.  ASA alone was recommended to avoid extension of intramural hematoma in the LAD.  He was started on beta blocker, ARB, statin.  He required K+ supplementation for hypokalemia.  ARB was held 2/2 low BP at DC.    Last seen by Dr. Sherren Mocha 3/8.  He returns for FU.  Overall he is doing well.  The patient denies chest pain, shortness of breath, syncope, orthopnea, PND or significant pedal edema. He is ready to go back to work.     Studies/Reports Reviewed Today:   Cardiac Catheterization 10/20/14 LAD:  Mid LAD tapers abruptly in an area of tortuosity. It terminates in the distal anterior wall and is suspected to be totally occluded in that area - Typical appearance of a spontaneous coronary artery dissection with intramural hematoma present. EF 50-55%, Inferoapex is akinetic.   Echocardiogram 10/22/14 - Left ventricle:  EF 55% to 60%. Cannot r/o WMA. Grade 1 diastolic dysfunction - Aortic valve: There was trivial regurgitation.     Past Medical History  Diagnosis Date  . Hypertension   . ST elevation (STEMI) myocardial infarction involving left anterior descending coronary artery 10/20/2014    a.  LHC 10/20/14:  LAD:  Mid LAD tapers abruptly in an area of tortuosity. It terminates in the distal anterior wall and is suspected to be totally occluded in that  area - Typical appearance of a spontaneous coronary artery dissection with intramural hematoma present. EF 50-55% with inf-apical AK;  b. Echo 10/22/14:  EF 55-60%, Gr 1 DD  . Dyslipidemia (high LDL; low HDL) 10/20/2014  . Dissection of coronary artery 10/20/2014    Distal LAD  . Ventricular fibrillation 10/20/2014    in the setting of acute STEMI    Past Surgical History  Procedure Laterality Date  . Left heart catheterization with coronary angiogram N/A 10/20/2014    Procedure: LEFT HEART CATHETERIZATION WITH CORONARY ANGIOGRAM;  Surgeon: Blane Ohara, MD; L main [place], LAD 100% distal (dissection), CFX 0%, RCA 0%, EF 50-55%      Current Outpatient Prescriptions  Medication Sig Dispense Refill  . aspirin 81 MG tablet Take 1 tablet (81 mg total) by mouth daily.    Marland Kitchen atorvastatin (LIPITOR) 20 MG tablet Take 1 tablet (20 mg total) by mouth daily at 6 PM. 30 tablet 11  . carvedilol (COREG) 12.5 MG tablet Take 1 tablet (12.5 mg total) by mouth 2 (two) times daily with a meal. 60 tablet 11  . nitroGLYCERIN (NITROSTAT) 0.4 MG SL tablet Place 1 tablet (0.4 mg total) under the tongue every 5 (five) minutes as needed for chest pain. 25 tablet 3   No current facility-administered medications for this visit.    Allergies:   Review of patient's allergies indicates no known allergies.    Social History:  The patient  reports that he has never smoked. He does not have any smokeless tobacco history on file. He reports that he does not drink alcohol or use illicit drugs.   Family History:  The patient's family history includes Asthma in his mother and son; Hypertension in his mother; Stroke in his brother. There is no history of Heart attack.    ROS:  Please see the history of present illness.     All other systems are reviewed and negative.    PHYSICAL EXAM: VS:  BP 122/88 mmHg  Pulse 58  Ht 5\' 9"  (1.753 m)  Wt 221 lb (100.245 kg)  BMI 32.62 kg/m2    Wt Readings from Last 3 Encounters:    01/04/15 221 lb (100.245 kg)  11/28/14 223 lb 12.8 oz (101.515 kg)  10/30/14 221 lb (100.245 kg)     GEN: Well nourished, well developed, in no acute distress HEENT: normal Neck: no JVD, no masses Cardiac:  Normal S1/S2, RRR; no murmur, no rubs or gallops, no edema, right wrist without hematoma or mass   Respiratory:  clear to auscultation bilaterally, no wheezing, rhonchi or rales. GI: soft, nontender, nondistended, + BS MS: no deformity or atrophy Skin: warm and dry  Neuro:  CNs II-XII intact, Strength and sensation are intact Psych: Normal affect   EKG:  EKG is ordered today.  It demonstrates:   NSR, HR 58, normal axis, inf Q waves, TWI V4-5, no significant change when compared to prior tracings.   Recent Labs: 10/20/2014: B Natriuretic Peptide 23.7 10/21/2014: Hemoglobin 12.3*; Platelets 297 11/24/2014: ALT 26; BUN 12; Creatinine 1.05; Potassium 4.5; Sodium 138    Lipid Panel    Component Value Date/Time   CHOL 136 11/24/2014 0835   TRIG 83.0 11/24/2014 0835   HDL 43.40 11/24/2014 0835   CHOLHDL 3 11/24/2014 0835   VLDL 16.6 11/24/2014 0835   LDLCALC 76 11/24/2014 0835      ASSESSMENT AND PLAN:  Coronary artery disease  He is s/p Inferoapical STEMI 2/2 Distal LAD Dissection.  He denies angina.  He has an "empty feeling" sometimes in his chest after exercise.  However no discomfort.  No other associated symptoms.  He is doing well in Cardiac Rehab.  He denies dizziness or near syncope.  Symptoms may be related to use of beta-blocker therapy.  If continues, could decrease beta-blocker dose and add angiotensin receptor blocker to assist in BP control.  Continue ASA, statin.  Give note today to return to work.  Essential hypertension Controlled.  Continue beta-blocker.  Add angiotensin receptor blocker if BP increases.  Hyperlipidemia Recent LDL optimal.  Continue statin Rx.    Current medicines are reviewed at length with the patient today.  The patient does not have  concerns regarding medicines.  The following changes have been made:   None    Labs/ tests ordered today include:  No orders of the defined types were placed in this encounter.    Disposition:   FU with Dr. Sherren Mocha as planned.    Signed, Alan Walker, MHS 01/04/2015 10:04 AM    [facility] [place], [place], [place]  [address] Phone: [phone]; Fax: [phone]

## 2015-01-03 NOTE — Progress Notes (Addendum)
Pt exited early  from cardiac rehab program today with completion of 14 exercise sessions in Phase II to return to work.   Pt maintained good attendance and progressed nicely during his participation in rehab as evidenced by increased MET level.   Medication list reconciled. Repeat  PHQ score-1. Pt does admit to some days with depressive symptoms related to his cardiac illness.  This is improving and he is looking forward to returning to work and his usual routine.  Pt denies other symptoms of depression, stress or anxiety.   Pt has made significant lifestyle changes and should be commended for his success. Pt feels he has achieved his goals during cardiac rehab, specifically incorporating aerobic exercise and heart healthy living into his lifestyle.  Pt plans to continue exercising on his own.

## 2015-01-03 NOTE — Telephone Encounter (Signed)
New Message  Jessica from Dr. Durenda Age office is calling, pt is in the chair ready for tooth extraction. Please call back and discuss.   Request for surgical clearance:  1. What type of surgery is being performed? Tooth extraction   2. When is this surgery scheduled? 4/13- TODAY  3. Are there any medications that need to be held prior to surgery and how long? Aspirin; pre med or antibiotics (due to heart attackt)  4. Name of physician performing surgery? [employer]  5. What is your office phone and fax number?   6.

## 2015-01-04 ENCOUNTER — Encounter: Payer: Self-pay | Admitting: Physician Assistant

## 2015-01-04 ENCOUNTER — Ambulatory Visit (INDEPENDENT_AMBULATORY_CARE_PROVIDER_SITE_OTHER): Payer: BLUE CROSS/BLUE SHIELD | Admitting: Physician Assistant

## 2015-01-04 ENCOUNTER — Encounter: Payer: Self-pay | Admitting: *Deleted

## 2015-01-04 VITALS — BP 122/88 | HR 58 | Ht 69.0 in | Wt 221.0 lb

## 2015-01-04 DIAGNOSIS — I2510 Atherosclerotic heart disease of native coronary artery without angina pectoris: Secondary | ICD-10-CM | POA: Diagnosis not present

## 2015-01-04 DIAGNOSIS — E785 Hyperlipidemia, unspecified: Secondary | ICD-10-CM | POA: Diagnosis not present

## 2015-01-04 DIAGNOSIS — I10 Essential (primary) hypertension: Secondary | ICD-10-CM

## 2015-01-04 NOTE — Patient Instructions (Signed)
Medication Instructions:  Your physician recommends that you continue on your current medications as directed. Please refer to the Current Medication list given to you today.   Labwork: NONE AT THIS TIME  Testing/Procedures: NONE AT THIS TIME  Follow-Up:  RECALL LETTER SET UP FOR 04/27/2015 TO BE SENT OUT TO HOME TO CALL BACK TO OFFICE TO SET UP FOLLOW UP APPOINTMENT FOR  6 MONTH FOLLOW UP   Any Other Special Instructions Will Be Listed Below (If Applicable).

## 2015-06-28 ENCOUNTER — Emergency Department (HOSPITAL_COMMUNITY)
Admission: EM | Admit: 2015-06-28 | Discharge: 2015-06-28 | Disposition: A | Discharge status: Home / Self Care | Payer: BLUE CROSS/BLUE SHIELD | Attending: Physician Assistant | Admitting: Physician Assistant

## 2015-06-28 ENCOUNTER — Encounter (HOSPITAL_COMMUNITY): Payer: Self-pay | Admitting: Emergency Medicine

## 2015-06-28 DIAGNOSIS — Z7982 Long term (current) use of aspirin: Secondary | ICD-10-CM | POA: Insufficient documentation

## 2015-06-28 DIAGNOSIS — Z79899 Other long term (current) drug therapy: Secondary | ICD-10-CM | POA: Insufficient documentation

## 2015-06-28 DIAGNOSIS — E785 Hyperlipidemia, unspecified: Secondary | ICD-10-CM | POA: Insufficient documentation

## 2015-06-28 DIAGNOSIS — I10 Essential (primary) hypertension: Secondary | ICD-10-CM | POA: Diagnosis present

## 2015-06-28 DIAGNOSIS — I252 Old myocardial infarction: Secondary | ICD-10-CM | POA: Diagnosis not present

## 2015-06-28 DIAGNOSIS — I159 Secondary hypertension, unspecified: Secondary | ICD-10-CM | POA: Insufficient documentation

## 2015-06-28 NOTE — ED Notes (Signed)
Pt sts htn this am when checked at work; pt denies any complaints and sts is taking his htn meds

## 2015-06-28 NOTE — ED Provider Notes (Signed)
CSN: [id number]     Arrival date & time 06/28/15  0813 History   First MD Initiated Contact with Patient 06/28/15 [phone]     Chief Complaint  Patient presents with  . Hypertension     (Consider location/radiation/quality/duration/timing/severity/associated sxs/prior Treatment) HPI   Patient is a very pleasant 54 year old man with history of STEMI and hypertension. Patient has been on blood pressure medications. He's been taking them as prescribed. He said he took it this morning at 4:30 AM and then has blood pressure checked at work at 6 AM and the diastolic was noted to be up over 100. They said they sent him home from work for the next 2 days. On arrival here his blood pressure is 130s over 84.  Patient had absolutely no symptoms. Past Medical History  Diagnosis Date  . Hypertension   . ST elevation (STEMI) myocardial infarction involving left anterior descending coronary artery ([place]) 10/20/2014    a.  LHC 10/20/14:  LAD:  Mid LAD tapers abruptly in an area of tortuosity. It terminates in the distal anterior wall and is suspected to be totally occluded in that area - Typical appearance of a spontaneous coronary artery dissection with intramural hematoma present. EF 50-55% with inf-apical AK;  b. Echo 10/22/14:  EF 55-60%, Gr 1 DD  . Dyslipidemia (high LDL; low HDL) 10/20/2014  . Dissection of coronary artery 10/20/2014    Distal LAD  . Ventricular fibrillation ([place]) 10/20/2014    in the setting of acute STEMI   Past Surgical History  Procedure Laterality Date  . Left heart catheterization with coronary angiogram N/A 10/20/2014    Procedure: LEFT HEART CATHETERIZATION WITH CORONARY ANGIOGRAM;  Surgeon: Blane Ohara, MD; L main [place], LAD 100% distal (dissection), CFX 0%, RCA 0%, EF 50-55%    Family History  Problem Relation Age of Onset  . Hypertension Mother   . Asthma Son   . Stroke Brother   . Asthma Mother   . Heart attack Neg Hx    Social History  Substance Use Topics  .  Smoking status: Never Smoker   . Smokeless tobacco: None  . Alcohol Use: No    Review of Systems  Constitutional: Negative for activity change.  Respiratory: Negative for shortness of breath.   Cardiovascular: Negative for chest pain.  Gastrointestinal: Negative for abdominal pain.      Allergies  Review of patient's allergies indicates no known allergies.  Home Medications   Prior to Admission medications   Medication Sig Start Date End Date Taking? Authorizing Provider  aspirin 81 MG tablet Take 1 tablet (81 mg total) by mouth daily. 10/23/14   Evelene Croon Barrett, PA-C  atorvastatin (LIPITOR) 20 MG tablet Take 1 tablet (20 mg total) by mouth daily at 6 PM. 10/23/14   Evelene Croon Barrett, PA-C  carvedilol (COREG) 12.5 MG tablet Take 1 tablet (12.5 mg total) by mouth 2 (two) times daily with a meal. 10/23/14   Evelene Croon Barrett, PA-C  nitroGLYCERIN (NITROSTAT) 0.4 MG SL tablet Place 1 tablet (0.4 mg total) under the tongue every 5 (five) minutes as needed for chest pain. 10/23/14   Rhonda G Barrett, PA-C   BP 131/84 mmHg  Pulse 55  Temp(Src) 97.7 F (36.5 C) (Oral)  Resp 21  SpO2 99% Physical Exam  Constitutional: He is oriented to person, place, and time. He appears well-nourished.  HENT:  Head: Normocephalic.  Eyes: Conjunctivae are normal.  Cardiovascular: Normal rate, regular rhythm and normal heart sounds.  No murmur heard. Pulmonary/Chest: Effort normal and breath sounds normal. He has no wheezes.  Neurological: He is oriented to person, place, and time.  Skin: Skin is warm and dry. He is not diaphoretic.  Psychiatric: He has a normal mood and affect. His behavior is normal.    ED Course  Procedures (including critical care time) Labs Review Labs Reviewed - No data to display  Imaging Review No results found. I have personally reviewed and evaluated these images and lab results as part of my medical decision-making.   EKG Interpretation None      MDM   Final  diagnoses:  None    Patient is a 54 year old gentleman with history of hypertension and STEMI presenting today with a symptomatically hypertension. Patient was found to be hypertensive in the 150/102 at his work this morning patient was sent here. Patient completely a symptomatically. We will do 1 EKG. However patient's blood pressure is completely normal now 131/84. Would not change any medications at this time. We'll have him follow-up with his primary care provider.    Fryda Molenda Julio Alm, MD 06/28/15 1621

## 2015-06-28 NOTE — ED Notes (Signed)
Pt is in stable condition upon d/c and ambulates from ED. 

## 2015-06-28 NOTE — Discharge Instructions (Signed)

## 2015-07-05 ENCOUNTER — Encounter: Payer: Self-pay | Admitting: Cardiovascular Disease

## 2015-07-05 ENCOUNTER — Ambulatory Visit (INDEPENDENT_AMBULATORY_CARE_PROVIDER_SITE_OTHER): Payer: BLUE CROSS/BLUE SHIELD | Admitting: Cardiovascular Disease

## 2015-07-05 VITALS — BP 121/70 | HR 72 | Ht 69.0 in | Wt 211.0 lb

## 2015-07-05 DIAGNOSIS — I10 Essential (primary) hypertension: Secondary | ICD-10-CM | POA: Diagnosis not present

## 2015-07-05 DIAGNOSIS — I2510 Atherosclerotic heart disease of native coronary artery without angina pectoris: Secondary | ICD-10-CM | POA: Diagnosis not present

## 2015-07-05 LAB — COMPLETE METABOLIC PANEL WITH GFR
ALT: 26 U/L (ref 9–46)
AST: 23 U/L (ref 10–35)
Albumin: 4 g/dL (ref 3.6–5.1)
Alkaline Phosphatase: 47 U/L (ref 40–115)
BUN: 16 mg/dL (ref 7–25)
CO2: 28 mmol/L (ref 20–31)
Calcium: 9.2 mg/dL (ref 8.6–10.3)
Chloride: 104 mmol/L (ref 98–110)
Creat: 0.97 mg/dL (ref 0.70–1.33)
GFR, Est African American: >89 mL/min (ref >=60)
GFR, Est Non African American: 89 mL/min (ref >=60)
Glucose, Bld: 100 mg/dL — ABNORMAL HIGH (ref 65–99)
Potassium: 4.1 mmol/L (ref 3.5–5.3)
Sodium: 136 mmol/L (ref 135–146)
Total Bilirubin: 0.7 mg/dL (ref 0.2–1.2)
Total Protein: 7.9 g/dL (ref 6.1–8.1)

## 2015-07-05 NOTE — Patient Instructions (Signed)
Medication Instructions:  Your physician has recommended you make the following change in your medication:  1. Take an extra Coreg (Carvedilol) as needed for Systolic BP (top number) greater than 092 or Diastolic BP (bottom number) greater than 100  Labwork: Your physician recommends that you have lab work today: CMP  Testing/Procedures: Your physician has requested that you have an exercise tolerance test in January 2017 with Richardson Dopp PA-C. For further information please visit [email or web address]. Please also follow instruction sheet, as given.  Follow-Up: Your physician wants you to follow-up in: 6 MONTHS with Dr Burt Knack.  You will receive a reminder letter in the mail two months in advance. If you don't receive a letter, please call our office to schedule the follow-up appointment.   Any Other Special Instructions Will Be Listed Below (If Applicable).

## 2015-07-05 NOTE — Progress Notes (Signed)
Cardiology Office Note Date:  07/05/2015   ID:  OZZY BOHLKEN, DOB 1952/11/23, MRN [id number]  PCP:  No PCP Per Patient  Cardiologist:  Sherren Mocha, MD    Chief Complaint  Patient presents with  . Follow-up   History of Present Illness: Alan Walker is a 54 y.o. male who presents for follow-up evaluation. The patient has a long history of hypertension. He presented with an ST elevation infarction in January 7829, complicated by ventricular fibrillation arrest. He was rapidly resuscitated with no long-term sequelae. He was found to have distal LAD occlusion with typical appearance of a spontaneous coronary artery dissection. Medical therapy was recommended. Early follow-up echo showed normal LV systolic function. The patient has done well without recurrent problems. At work recently he was noted to have markedly elevated blood pressure and he went to the emergency room for further evaluation. When he arrived there is blood pressure was completely normal. No changes in his medications were recommended. He had no symptoms at that time. He continues to exercise regularly. He jogs 3-4 miles without shortness of breath or chest pain. He is compliant with his medications.   Past Medical History  Diagnosis Date  . Hypertension   . ST elevation (STEMI) myocardial infarction involving left anterior descending coronary artery ([place]) 10/20/2014    a.  LHC 10/20/14:  LAD:  Mid LAD tapers abruptly in an area of tortuosity. It terminates in the distal anterior wall and is suspected to be totally occluded in that area - Typical appearance of a spontaneous coronary artery dissection with intramural hematoma present. EF 50-55% with inf-apical AK;  b. Echo 10/22/14:  EF 55-60%, Gr 1 DD  . Dyslipidemia (high LDL; low HDL) 10/20/2014  . Dissection of coronary artery 10/20/2014    Distal LAD  . Ventricular fibrillation ([place]) 10/20/2014    in the setting of acute STEMI    Past Surgical History    Procedure Laterality Date  . Left heart catheterization with coronary angiogram N/A 10/20/2014    Procedure: LEFT HEART CATHETERIZATION WITH CORONARY ANGIOGRAM;  Surgeon: Blane Ohara, MD; L main OK, LAD 100% distal (dissection), CFX 0%, RCA 0%, EF 50-55%     Current Outpatient Prescriptions  Medication Sig Dispense Refill  . aspirin 81 MG tablet Take 1 tablet (81 mg total) by mouth daily.    Marland Kitchen atorvastatin (LIPITOR) 20 MG tablet Take 1 tablet (20 mg total) by mouth daily at 6 PM. 30 tablet 11  . carvedilol (COREG) 12.5 MG tablet Take 1 tablet (12.5 mg total) by mouth 2 (two) times daily with a meal. 60 tablet 11  . nitroGLYCERIN (NITROSTAT) 0.4 MG SL tablet Place 1 tablet (0.4 mg total) under the tongue every 5 (five) minutes as needed for chest pain. 25 tablet 3   No current facility-administered medications for this visit.    Allergies:   Review of patient's allergies indicates no known allergies.   Social History:  The patient  reports that he has never smoked. He does not have any smokeless tobacco history on file. He reports that he does not drink alcohol or use illicit drugs.   Family History:  The patient's  family history includes Asthma in his mother and son; Hypertension in his mother; Stroke in his brother. There is no history of Heart attack.    ROS:  Please see the history of present illness.  Otherwise, review of systems is positive for rash.  All other systems are reviewed and negative.  PHYSICAL EXAM: VS:  BP 121/70 mmHg  Pulse 72  Ht 5\' 9"  (1.753 m)  Wt 211 lb (95.709 kg)  BMI 31.15 kg/m2 , BMI Body mass index is 31.15 kg/(m^2). GEN: Well nourished, well developed, in no acute distress HEENT: normal Neck: no JVD, no masses. No carotid bruits Cardiac: RRR without murmur or gallop                Respiratory:  clear to auscultation bilaterally, normal work of breathing GI: soft, nontender, nondistended, + BS MS: no deformity or atrophy Ext: no pretibial  edema, pedal pulses 2+= bilaterally Skin: warm and dry, no rash Neuro:  Strength and sensation are intact Psych: euthymic mood, full affect  EKG:  EKG is not ordered today.  Recent Labs: 10/20/2014: B Natriuretic Peptide 23.7 10/21/2014: Hemoglobin 12.3*; Platelets 297 11/24/2014: ALT 26; BUN 12; Creatinine, Ser 1.05; Potassium 4.5; Sodium 138   Lipid Panel     Component Value Date/Time   CHOL 136 11/24/2014 0835   TRIG 83.0 11/24/2014 0835   HDL 43.40 11/24/2014 0835   CHOLHDL 3 11/24/2014 0835   VLDL 16.6 11/24/2014 0835   LDLCALC 76 11/24/2014 0835      Wt Readings from Last 3 Encounters:  07/05/15 211 lb (95.709 kg)  01/04/15 221 lb (100.245 kg)  11/30/14 224 lb 6.9 oz (101.8 kg)     Cardiac Studies Reviewed: Cardiac Catheterization 10/20/2014: Coronary angiography: Coronary dominance: Codominant  Left mainstem: Widely patent with no obstruction. Widely patent vessel arising from the left coronary cusp.  Left anterior descending (LAD): The proximal LAD is widely patent. The vessel shows no evidence of obstruction. The first 2 diagonals are widely patent with no obstruction. In the mid LAD, the vessel tapers abruptly in an area of tortuosity. It terminates in the distal anterior wall and I suspect the vessel is totally occluded in that area. The vessel has a very typical appearance of a spontaneous coronary artery dissection with intramural hematoma present.  Left circumflex (LCx): Large-caliber vessel, smooth throughout its course. There is no obstructive disease noted. The vessel supplies a small first OM and a large second obtuse marginal branch  Right coronary artery (RCA): Small, nondominant or potentially codominant vessel without obstruction. The vessel is smooth throughout its course.  Left ventriculography: The inferoapex is akinetic. The other LV wall segments are hyperdynamic with an estimated LVEF of 50-55%.  Estimated Blood Loss: Minimal  Final Conclusions:   1. Distal LAD occlusion with typical angiographic appearance of spontaneous coronary artery dissection 2. No other evidence of atherosclerotic or obstructive coronary artery disease 3. Mild segmental contraction abnormality of the left ventricle consistent with inferoapical myocardial infarction  Recommendations: I think this patient has had spontaneous coronary artery dissection. He has no risk factors for this, but his angiogram has a classic appearance. He is now stable with no chest pain. Based on the EKG injury current and wall motion abnormality, I suspect his LAD wrapped around the LV apex and supplied the inferoapical wall. He will be treated medically. Would avoid anticoagulation or dual antiplatelet therapy based on concern over extension of intramural hematoma in the LAD. Would treat with aspirin alone. Will check a lipid panel, and would only start a statin drug if he has significant hyperlipidemia. There apparently is some data that statin drugs may be detrimental in patients with spontaneous coronary dissection. The patient will be transferred to the CCU where he should remain an inpatient for at least another 48 hours.  2D Echo 10/22/2014: Study Conclusions  - Left ventricle: The cavity size was normal. Wall thickness was normal. Systolic function was normal. The estimated ejection fraction was in the range of 55% to 60%. Although no diagnostic regional wall motion abnormality was identified, this possibility cannot be completely excluded on the basis of this study. Doppler parameters are consistent with abnormal left ventricular relaxation (grade 1 diastolic dysfunction). - Aortic valve: There was trivial regurgitation. Valve area (Vmax): 3.25 cm^2.  ASSESSMENT AND PLAN: 1.  CAD, native vessel: stable without angina. He lifts weights, jogs, and stays active without exertional symptoms.  I have recommended an exercise treadmill study in January when he is 1 year out  from his initial infarct. He will continue his current medical program.   2. Essential HTN: BP seems to be well-controlled.  advised he can take an extra carvedilol as needed for systolic blood pressure greater than 462 mmHg or diastolic blood pressure greater than 100 mmHg.   3. Hyperlipidemia: lipids at goal in March with LDL 76, HDL 43, total chol 136, and Trig 83. Continue statin drug. Check LFT's today for monitoring.  Current medicines are reviewed with the patient today.  The patient does not have concerns regarding medicines.  Labs/ tests ordered today include:   Orders Placed This Encounter  Procedures  . COMPLETE METABOLIC PANEL WITH GFR  . Exercise Tolerance Test    Disposition:   FU 6 months  Signed, Sherren Mocha, MD  07/05/2015 1:12 PM    [facility] [place], [place], [place]  [address] Phone: [phone]; Fax: [phone]

## 2015-10-09 ENCOUNTER — Ambulatory Visit (INDEPENDENT_AMBULATORY_CARE_PROVIDER_SITE_OTHER): Payer: BLUE CROSS/BLUE SHIELD

## 2015-10-09 ENCOUNTER — Encounter: Payer: BLUE CROSS/BLUE SHIELD | Admitting: Physician Assistant

## 2015-10-09 DIAGNOSIS — I10 Essential (primary) hypertension: Secondary | ICD-10-CM

## 2015-10-09 LAB — EXERCISE TOLERANCE TEST
Estimated workload: 14.3 METS
Exercise duration (min): 12 min
Exercise duration (sec): 32 s
MPHR: 166 {beats}/min
Peak HR: 164 {beats}/min
Percent HR: 98 %
RPE: 18
Rest HR: 63 {beats}/min

## 2015-10-29 ENCOUNTER — Other Ambulatory Visit: Payer: Self-pay | Admitting: Physician Assistant

## 2015-10-29 ENCOUNTER — Other Ambulatory Visit: Payer: Self-pay | Admitting: *Deleted

## 2015-10-29 MED ORDER — CARVEDILOL 12.5 MG PO TABS: 12.5000 mg | ORAL_TABLET | Freq: Two times a day (BID) | ORAL | Status: DC

## 2015-10-29 MED ORDER — ATORVASTATIN CALCIUM 20 MG PO TABS: 20.0000 mg | ORAL_TABLET | Freq: Every day | ORAL | Status: DC

## 2016-08-05 ENCOUNTER — Other Ambulatory Visit: Payer: Self-pay | Admitting: Cardiovascular Disease

## 2016-09-06 ENCOUNTER — Other Ambulatory Visit: Payer: Self-pay | Admitting: Cardiovascular Disease

## 2016-09-08 ENCOUNTER — Telehealth: Payer: Self-pay | Admitting: Cardiovascular Disease

## 2016-09-08 MED ORDER — CARVEDILOL 12.5 MG PO TABS: ORAL_TABLET | ORAL | 0 refills | Status: DC

## 2016-09-08 MED ORDER — ATORVASTATIN CALCIUM 20 MG PO TABS: 20.0000 mg | ORAL_TABLET | Freq: Every day | ORAL | 0 refills | Status: DC

## 2016-09-08 NOTE — Telephone Encounter (Signed)
New Message   *STAT* If patient is at the pharmacy, call can be transferred to refill team.   1. Which medications need to be refilled? (please list name of each medication and dose if known)  carvedilol (Coreg) 12.5 mg twice daily atorvastatin (Lipitor) 20 mg once daily  2. Which pharmacy/location (including street and city if local pharmacy) is medication to be sent to? [place], [place]  3. Do they need a 30 day or 90 day supply?  30 day supply

## 2016-09-11 ENCOUNTER — Encounter: Payer: Self-pay | Admitting: Cardiovascular Disease

## 2016-09-11 ENCOUNTER — Ambulatory Visit (INDEPENDENT_AMBULATORY_CARE_PROVIDER_SITE_OTHER): Payer: BLUE CROSS/BLUE SHIELD | Admitting: Cardiovascular Disease

## 2016-09-11 ENCOUNTER — Encounter: Payer: Self-pay | Admitting: Nurse Practitioner

## 2016-09-11 VITALS — BP 150/102 | HR 58 | Ht 70.0 in | Wt 220.0 lb

## 2016-09-11 DIAGNOSIS — E785 Hyperlipidemia, unspecified: Secondary | ICD-10-CM | POA: Diagnosis not present

## 2016-09-11 DIAGNOSIS — I10 Essential (primary) hypertension: Secondary | ICD-10-CM | POA: Diagnosis not present

## 2016-09-11 DIAGNOSIS — I2510 Atherosclerotic heart disease of native coronary artery without angina pectoris: Secondary | ICD-10-CM

## 2016-09-11 MED ORDER — AMLODIPINE BESYLATE 5 MG PO TABS: 5.0000 mg | ORAL_TABLET | Freq: Every day | ORAL | 11 refills | Status: DC

## 2016-09-11 NOTE — Progress Notes (Signed)
Cardiology Office Note Date:  09/11/2016   ID:  Alan Walker, DOB 08/13/1961, MRN [id number]  PCP:  No PCP Per Patient  Cardiologist:  Sherren Mocha, MD    Chief Complaint  Patient presents with  . Follow-up     History of Present Illness: Alan Walker is a 55 y.o. male who presents for follow-up evaluation.  He presented with an ST elevation infarction in January [id number], complicated by ventricular fibrillation arrest. He was rapidly resuscitated with no long-term sequelae. He was found to have distal LAD occlusion with typical appearance of a spontaneous coronary artery dissection. Medical therapy was recommended. Early follow-up echo showed normal LV systolic function. The patient has done well without recurrent problems.  The patient reports a long-standing history of white-coat hypertension. He doesn't monitor his blood pressure at home is not sure where his blood pressure has been running. He is compliant with his medications. He feels well. He has run to 5K races since I have seen him last. He denies exertional chest pain, chest pressure, heart palpitations, shortness of breath, lightheadedness, or syncope.  Past Medical History:  Diagnosis Date  . Dissection of coronary artery 10/20/2014   Distal LAD  . Dyslipidemia (high LDL; low HDL) 10/20/2014  . Hypertension   . ST elevation (STEMI) myocardial infarction involving left anterior descending coronary artery ([place]) 10/20/2014   a.  LHC 10/20/14:  LAD:  Mid LAD tapers abruptly in an area of tortuosity. It terminates in the distal anterior wall and is suspected to be totally occluded in that area - Typical appearance of a spontaneous coronary artery dissection with intramural hematoma present. EF 50-55% with inf-apical AK;  b. Echo 10/22/14:  EF 55-60%, Gr 1 DD  . Ventricular fibrillation (Alan Walker) 10/20/2014   in the setting of acute STEMI    Past Surgical History:  Procedure Laterality Date  . LEFT HEART CATHETERIZATION  WITH CORONARY ANGIOGRAM N/A 10/20/2014   Procedure: LEFT HEART CATHETERIZATION WITH CORONARY ANGIOGRAM;  Surgeon: Blane Ohara, MD; L main OK, LAD 100% distal (dissection), CFX 0%, RCA 0%, EF 50-55%     Current Outpatient Prescriptions  Medication Sig Dispense Refill  . aspirin 81 MG tablet Take 1 tablet (81 mg total) by mouth daily.    Marland Kitchen atorvastatin (LIPITOR) 20 MG tablet Take 1 tablet (20 mg total) by mouth daily at 6 PM. 30 tablet 0  . carvedilol (COREG) 12.5 MG tablet TAKE 1 TABLET(12.5 MG) BY MOUTH TWICE DAILY WITH A MEAL 60 tablet 0  . nitroGLYCERIN (NITROSTAT) 0.4 MG SL tablet Place 1 tablet (0.4 mg total) under the tongue every 5 (five) minutes as needed for chest pain. 25 tablet 3   No current facility-administered medications for this visit.     Allergies:   Patient has no known allergies.   Social History:  The patient  reports that he has never smoked. He does not have any smokeless tobacco history on file. He reports that he does not drink alcohol or use drugs.   Family History:  The patient's  family history includes Asthma in his mother and son; Hypertension in his mother; Stroke in his brother.    ROS:  Please see the history of present illness.  Otherwise, review of systems is positive for back pain.  All other systems are reviewed and negative.    PHYSICAL EXAM: VS:  BP (!) 150/102   Pulse (!) 58   Ht 5\' 10"  (1.778 m)   Wt 220 lb (99.8  kg)   BMI 31.57 kg/m  , BMI Body mass index is 31.57 kg/m. GEN: Well nourished, well developed, in no acute distress  HEENT: normal  Neck: no JVD, no masses. No carotid bruits Cardiac: RRR without murmur or gallop                Respiratory:  clear to auscultation bilaterally, normal work of breathing GI: soft, nontender, nondistended, + BS MS: no deformity or atrophy  Ext: no pretibial edema, pedal pulses 2+= bilaterally Skin: warm and dry, no rash Neuro:  Strength and sensation are intact Psych: euthymic mood, full  affect  EKG:  EKG is ordered today. The ekg ordered today shows Sinus bradycardia 57 bpm, inferior infarct age undetermined.  Recent Labs: No results found for requested labs within last 8760 hours.   Lipid Panel     Component Value Date/Time   CHOL 136 11/24/2014 0835   TRIG 83.0 11/24/2014 0835   HDL 43.40 11/24/2014 0835   CHOLHDL 3 11/24/2014 0835   VLDL 16.6 11/24/2014 0835   LDLCALC 76 11/24/2014 0835      Wt Readings from Last 3 Encounters:  09/11/16 220 lb (99.8 kg)  07/05/15 211 lb (95.7 kg)  01/04/15 221 lb (100.2 kg)     Cardiac Studies Reviewed: GXT 10-09-2015: Stress Findings   ECG Baseline ECG exhibits normal sinus rhythm..    Stress Findings The patient exercised following the Bruce protocol.  The patient reported fatigue during the stress test. The patient experienced no angina during the stress test.   The test was stopped because the patient complained of fatigue.   Blood pressure and heart rate demonstrated a normal response to exercise. Overall, the patient's exercise capacity was excellent.   85% of maximum heart rate was achieved after 9.5 minutes. The patient's response to exercise was adequate for diagnosis. Excellent exercise tolerance.Some PAC's and PVC's. No EKG changes or chest pain.Normal GXT.    Response to Stress There was no ST segment deviation noted during stress.  Arrhythmias during stress: rare PACsoccasional PVCs.  Arrhythmias during recovery: none.  Arrhythmias were not significant.  ECG was interpretable and conclusive.    Stress Measurements   Baseline Vitals  Rest HR 63 bpm    Rest BP 151/105 mmHg    Exercise Time  Exercise duration (min) 12 min    Exercise duration (sec) 32 sec    Peak Stress Vitals  Peak HR 164 bpm    Peak BP 173/85 mmHg    Exercise Data  MPHR 166 bpm    Percent HR 98 %    RPE 18     Estimated workload 14.3 METS         ASSESSMENT AND PLAN: 1.  CAD, native vessel: Patient is stable  with no symptoms of angina. He has a good exercise program and denies any exertional symptoms. His medicines are reviewed.  2. Hypertension, uncontrolled: While he has a history of whitecoat hypertension, he has significant diastolic hypertension. On my recheck his blood pressure is 145/105 mmHg. Advised that he adds amlodipine 5 mg daily. He will continue on carvedilol 12.5 mg twice daily. Follow-up blood pressure check in the hypertension clinic in one month.  3. Hyperlipidemia: Continue atorvastatin. Check lipids and LFTs when he returns next month.  Current medicines are reviewed with the patient today.  The patient does not have concerns regarding medicines.  Labs/ tests ordered today include:  No orders of the defined types were placed in this encounter.  Disposition:   FU 1 month with HTN clinic (PharmD), I will see back in one year unless problems arise.   Deatra James, MD  09/11/2016 4:09 PM    [facility] [place], [place], [place]  [address] Phone: [phone]; Fax: [phone]

## 2016-09-11 NOTE — Patient Instructions (Signed)
Medication Instructions:  START AMLODIPINE (NORVASC) 5 MG DAILY  Labwork: DO YOUR FASTING LAB WORK ON THE SAME DAY AS YOUR APPOINTMENT IN THE HYPERTENSION CLINIC--CMET, LIPID  Testing/Procedures: NONE  Follow-Up: Your physician recommends that you schedule a follow-up appointment in: [place].  Your physician wants you to follow-up in: [place]. You will receive a reminder letter in the mail two months in advance. If you don't receive a letter, please call our office to schedule the follow-up appointment.    If you need a refill on your cardiac medications before your next appointment, please call your pharmacy.

## 2016-10-07 ENCOUNTER — Other Ambulatory Visit: Payer: Self-pay | Admitting: Cardiovascular Disease

## 2016-10-14 NOTE — Progress Notes (Signed)
Patient ID: Alan Walker                 DOB: 07-06-1961                      MRN: [id number]     HPI: Alan Walker is a 56 y.o. male patient of Dr. Burt Knack with PMH below who presents today for hypertension evaluation. Per Dr. Antionette Char most recent note has long-standing history of white-coat hypertension. At this visit, 1 month ago, his pressure was elevated to 150/102 and amlodipine 5mg  was started.   Today he presents stating that he has done well on his current medication regimen. He denies swelling in lower extremities and states that he works on his feet about 10 hours per day.   He states he has had 1 episode of dizziness over the last month when he got up quickly from laying down. He knows to get up slowly and sit before standing.   He asked about using High altitude Mask 3.0 to train for his 5Ks as he has a friend that uses one and this has really improved his conditioning.    Cardiac Hx: HTN, STEMI (09/2014) with normal EF, coronary artery dissection, HLD  Current HTN meds:  Carvedilol 12.5mg  BID Amlodipine 5mg  daily  Previously tried:  Losartan - stopped due to low blood pressure Metoprolol   BP goal: <130/80  Family History: His mother had HTN and asthma.  Social History: Denies tobacco products. Endorses only rare alcohol on special occasions.   Diet: He eats most of his meals from home and has tried to cut back on salt and seasonings. He has stopped coffee. He does endorse several glasses of tea per day. He states on a day he behaves he has 2 glasses.   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 He lifts weights and runs at least 2 days a week.   Home BP readings: He plans to purchase home cuff. He has been monitoring at work and pressures have been in 100s/70s mostly with occasional spikes to 130s/80s.   Wt Readings from Last 3 Encounters:  10/15/16 220 lb 12 oz (100.1 kg)  09/11/16 220 lb (99.8 kg)  07/05/15 211 lb  (95.7 kg)   BP Readings from Last 3 Encounters:  10/15/16 112/82  09/11/16 (!) 150/102  07/05/15 121/70   Pulse Readings from Last 3 Encounters:  10/15/16 63  09/11/16 (!) 58  07/05/15 72    Renal function: CrCl cannot be calculated (Patient's most recent lab result is older than the maximum 21 days allowed.).  Past Medical History:  Diagnosis Date  . Dissection of coronary artery 10/20/2014   Distal LAD  . Dyslipidemia (high LDL; low HDL) 10/20/2014  . Hypertension   . ST elevation (STEMI) myocardial infarction involving left anterior descending coronary artery ([place]) 10/20/2014   a.  LHC 10/20/14:  LAD:  Mid LAD tapers abruptly in an area of tortuosity. It terminates in the distal anterior wall and is suspected to be totally occluded in that area - Typical appearance of a spontaneous coronary artery dissection with intramural hematoma present. EF 50-55% with inf-apical AK;  b. Echo 10/22/14:  EF 55-60%, Gr 1 DD  . Ventricular fibrillation ([place]) 10/20/2014   in the setting of acute STEMI    Current Outpatient Prescriptions on File Prior to Visit  Medication Sig Dispense Refill  . amLODipine (NORVASC) 5 MG  tablet Take 1 tablet (5 mg total) by mouth daily. 30 tablet 11  . aspirin 81 MG tablet Take 1 tablet (81 mg total) by mouth daily.    Marland Kitchen atorvastatin (LIPITOR) 20 MG tablet TAKE 1 TABLET(20 MG) BY MOUTH DAILY AT 6 PM 30 tablet 3  . carvedilol (COREG) 12.5 MG tablet TAKE 1 TABLET BY MOUTH TWICE DAILY WITH MEALS 60 tablet 3  . nitroGLYCERIN (NITROSTAT) 0.4 MG SL tablet Place 1 tablet (0.4 mg total) under the tongue every 5 (five) minutes as needed for chest pain. (Patient not taking: Reported on 10/15/2016) 25 tablet 3   No current facility-administered medications on file prior to visit.     No Known Allergies  Blood pressure 112/82, pulse 63, weight 220 lb 12 oz (100.1 kg), SpO2 98 %.   Assessment/Plan: Hypertension: BP today borderline controlled and much improved from  previous visit. No medication changes today. He would likely benefit from ACEi/ARB if pressure remains elevated with history of STEMI. Will plan to see him back for follow up in 6-8 weeks. If pressure still borderline at that time could increase amlodipine or preferably start ACEi/ARB.   Will route message to Dr. Burt Knack regarding training mask and call him with Dr. Antionette Char recommendations.    Thank you, Lelan Pons. Patterson Hammersmith, [facility]  10/15/2016 8:47 AM   LM to make patient aware of Dr. Antionette Char recommendations with avoiding training mask.

## 2016-10-15 ENCOUNTER — Ambulatory Visit (INDEPENDENT_AMBULATORY_CARE_PROVIDER_SITE_OTHER): Payer: BLUE CROSS/BLUE SHIELD | Admitting: Pharmacist

## 2016-10-15 ENCOUNTER — Encounter: Payer: Self-pay | Admitting: Pharmacist

## 2016-10-15 ENCOUNTER — Encounter (INDEPENDENT_AMBULATORY_CARE_PROVIDER_SITE_OTHER): Payer: Self-pay

## 2016-10-15 ENCOUNTER — Other Ambulatory Visit: Payer: BLUE CROSS/BLUE SHIELD | Admitting: *Deleted

## 2016-10-15 VITALS — BP 112/82 | HR 63 | Wt 220.8 lb

## 2016-10-15 DIAGNOSIS — I10 Essential (primary) hypertension: Secondary | ICD-10-CM | POA: Diagnosis not present

## 2016-10-15 DIAGNOSIS — E784 Other hyperlipidemia: Secondary | ICD-10-CM | POA: Diagnosis not present

## 2016-10-15 DIAGNOSIS — E785 Hyperlipidemia, unspecified: Secondary | ICD-10-CM

## 2016-10-15 DIAGNOSIS — I2542 Coronary artery dissection: Secondary | ICD-10-CM | POA: Diagnosis not present

## 2016-10-15 NOTE — Patient Instructions (Addendum)
Return for a follow up appointment in 6-8 weeks  Check your blood pressure at home daily (if able) and keep record of the readings.  Take your BP meds as follows: Continue amlodipine 5mg  daily and carvedilol 12.5mg  twice a day  Bring all of your meds, your BP cuff and your record of home blood pressures to your next appointment.  Exercise as you're able, try to walk approximately 30 minutes per day.  Keep salt intake to a minimum, especially watch canned and prepared boxed foods.  Eat more fresh fruits and vegetables and fewer canned items.  Avoid eating in fast food restaurants.    HOW TO TAKE YOUR BLOOD PRESSURE: . Rest 5 minutes before taking your blood pressure. .  Don't smoke or drink caffeinated beverages for at least 30 minutes before. . Take your blood pressure before (not after) you eat. . Sit comfortably with your back supported and both feet on the floor (don't cross your legs). . Elevate your arm to heart level on a table or a desk. . Use the proper sized cuff. It should fit smoothly and snugly around your bare upper arm. There should be enough room to slip a fingertip under the cuff. The bottom edge of the cuff should be 1 inch above the crease of the elbow. . Ideally, take 3 measurements at one sitting and record the average.

## 2016-10-16 LAB — COMPREHENSIVE METABOLIC PANEL
ALT: 59 IU/L — ABNORMAL HIGH (ref 0–44)
AST: 38 IU/L (ref 0–40)
Albumin/Globulin Ratio: 1.1 — ABNORMAL LOW (ref 1.2–2.2)
Albumin: 4.1 g/dL (ref 3.5–5.5)
Alkaline Phosphatase: 52 IU/L (ref 39–117)
BUN/Creatinine Ratio: 17 (ref 9–20)
BUN: 16 mg/dL (ref 6–24)
Bilirubin Total: 0.6 mg/dL (ref 0.0–1.2)
CO2: 25 mmol/L (ref 18–29)
Calcium: 8.9 mg/dL (ref 8.7–10.2)
Chloride: 103 mmol/L (ref 96–106)
Creatinine, Ser: 0.94 mg/dL (ref 0.76–1.27)
GFR calc Af Amer: 105 mL/min/{1.73_m2} (ref >59)
GFR calc non Af Amer: 91 mL/min/{1.73_m2} (ref >59)
Globulin, Total: 3.6 g/dL (ref 1.5–4.5)
Glucose: 104 mg/dL — ABNORMAL HIGH (ref 65–99)
Potassium: 4.1 mmol/L (ref 3.5–5.2)
Sodium: 142 mmol/L (ref 134–144)
Total Protein: 7.7 g/dL (ref 6.0–8.5)

## 2016-10-16 LAB — LIPID PANEL
Chol/HDL Ratio: 3 ratio units (ref 0.0–5.0)
Cholesterol, Total: 146 mg/dL (ref 100–199)
HDL: 48 mg/dL (ref >39)
LDL Calculated: 86 mg/dL (ref 0–99)
Triglycerides: 61 mg/dL (ref 0–149)
VLDL Cholesterol Cal: 12 mg/dL (ref 5–40)

## 2016-10-17 ENCOUNTER — Telehealth: Payer: Self-pay | Admitting: Cardiovascular Disease

## 2016-10-17 DIAGNOSIS — R748 Abnormal levels of other serum enzymes: Secondary | ICD-10-CM

## 2016-10-17 NOTE — Telephone Encounter (Signed)
Called, pt unavailable. Informed recent labs awaiting review by Dr. Burt Knack. Informed our office will call pt once Dr. Burt Knack makes his recommendation.

## 2016-10-17 NOTE — Telephone Encounter (Signed)
New message ° ° °Pt verbalized that he is calling for lab results  °

## 2016-10-20 NOTE — Telephone Encounter (Signed)
Left message on machine for pt to contact the office.   

## 2016-10-20 NOTE — Telephone Encounter (Signed)
Patient calling to get lab results. Thanks.

## 2016-10-20 NOTE — Telephone Encounter (Signed)
Labs have been reviewed. See notes

## 2016-10-20 NOTE — Telephone Encounter (Signed)
I left a detailed message on the pt's identified voicemail with lab results. Plan to repeat LFT 04/14/17.

## 2016-12-11 NOTE — Progress Notes (Deleted)
Patient ID: Alan Walker                 DOB: October 04, 1960                      MRN: [id number]     HPI: Alan Walker is a 56 y.o. male patient of Dr. Burt Knack with PMH below who presents today for hypertension follow up. It was previously noted that patient has a long standing history of white coat hypertension. At his most recent visit no medication changes were made as pressure was borderline controlled at 112/82. He had 1 episode of dizziness after standing up quickly.     Cardiac Hx: HTN, STEMI (09/2014) with normal EF, coronary artery dissection, HLD  Current HTN meds:  Carvedilol 12.5mg  BID Amlodipine 5mg  daily  Previously tried:  Losartan - stopped due to low blood pressure Metoprolol   BP goal: <130/80  Family History: His mother had HTN and asthma.  Social History: Denies tobacco products. Endorses only rare alcohol on special occasions.   Diet: He eats most of his meals from home and has tried to cut back on salt and seasonings. He has stopped coffee. He does endorse several glasses of tea per day. He states on a day he behaves he has 2 glasses.   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 He lifts weights and runs at least 2 days a week.   Home BP readings:   Wt Readings from Last 3 Encounters:  10/15/16 220 lb 12 oz (100.1 kg)  09/11/16 220 lb (99.8 kg)  07/05/15 211 lb (95.7 kg)   BP Readings from Last 3 Encounters:  10/15/16 112/82  09/11/16 (!) 150/102  07/05/15 121/70   Pulse Readings from Last 3 Encounters:  10/15/16 63  09/11/16 (!) 58  07/05/15 72    Renal function: CrCl cannot be calculated (Patient's most recent lab result is older than the maximum 21 days allowed.).  Past Medical History:  Diagnosis Date  . Dissection of coronary artery 10/20/2014   Distal LAD  . Dyslipidemia (high LDL; low HDL) 10/20/2014  . Hypertension   . ST elevation (STEMI) myocardial infarction involving left anterior  descending coronary artery ([place]) 10/20/2014   a.  LHC 10/20/14:  LAD:  Mid LAD tapers abruptly in an area of tortuosity. It terminates in the distal anterior wall and is suspected to be totally occluded in that area - Typical appearance of a spontaneous coronary artery dissection with intramural hematoma present. EF 50-55% with inf-apical AK;  b. Echo 10/22/14:  EF 55-60%, Gr 1 DD  . Ventricular fibrillation ([place]) 10/20/2014   in the setting of acute STEMI    Current Outpatient Prescriptions on File Prior to Visit  Medication Sig Dispense Refill  . amLODipine (NORVASC) 5 MG tablet Take 1 tablet (5 mg total) by mouth daily. 30 tablet 11  . aspirin 81 MG tablet Take 1 tablet (81 mg total) by mouth daily.    Marland Kitchen atorvastatin (LIPITOR) 20 MG tablet TAKE 1 TABLET(20 MG) BY MOUTH DAILY AT 6 PM 30 tablet 3  . carvedilol (COREG) 12.5 MG tablet TAKE 1 TABLET BY MOUTH TWICE DAILY WITH MEALS 60 tablet 3  . nitroGLYCERIN (NITROSTAT) 0.4 MG SL tablet Place 1 tablet (0.4 mg total) under the tongue every 5 (five) minutes as needed for chest pain. (Patient not taking: Reported on 10/15/2016) 25 tablet 3   No  current facility-administered medications on file prior to visit.     No Known Allergies  There were no vitals taken for this visit.   Assessment/Plan: Hypertension:    Patient was seen today with Catie Darnelle Maffucci, PharmD Candidate 2018.   Thank you, Lelan Pons. Patterson Hammersmith, [facility]  12/11/2016 8:19 PM

## 2016-12-12 ENCOUNTER — Ambulatory Visit: Payer: BLUE CROSS/BLUE SHIELD

## 2016-12-27 IMAGING — CT CT HEAD W/O CM
2 series · 15 of 30 positions shown, 19 images · non-contrast
Comparison: None

CLINICAL DATA: Pain behind the eyes with blurred vision and
dizziness.

EXAM:
CT HEAD WITHOUT CONTRAST
TECHNIQUE: Contiguous axial images were obtained from the base of the skull
through the vertex without contrast.

[Series 201: head w/o, idose (1) · axial · non-contrast · 0.44mm/px · z∈[+99,+239]mm · 13 of 34 slices shown, 17 images]
[im 3/34  brain]
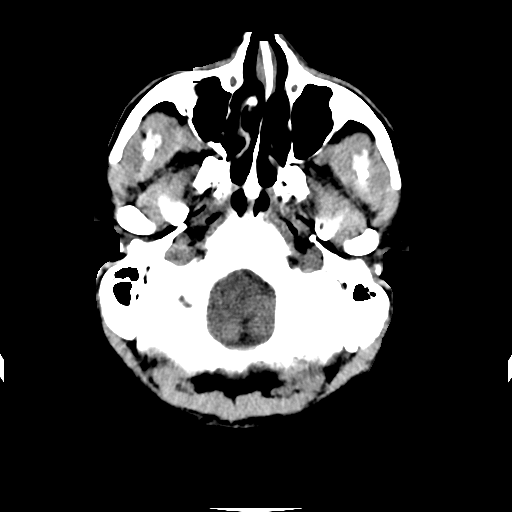
[im 3/34  bone]
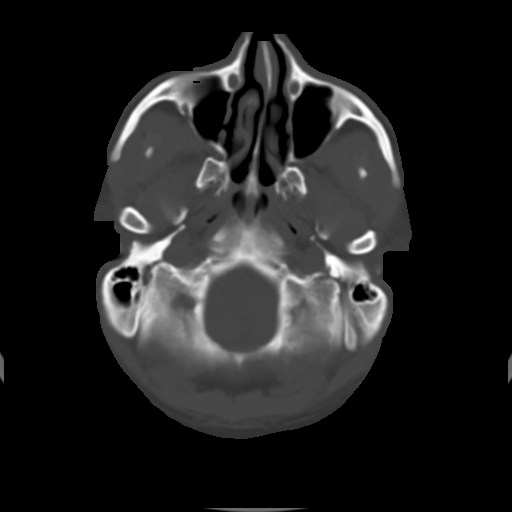
[im 5/34  brain]
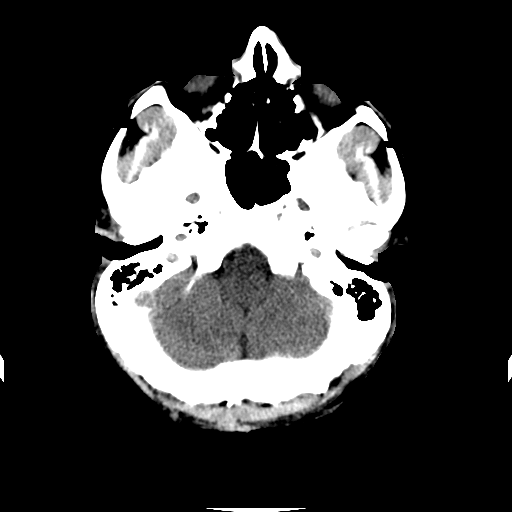
[im 8/34  brain]
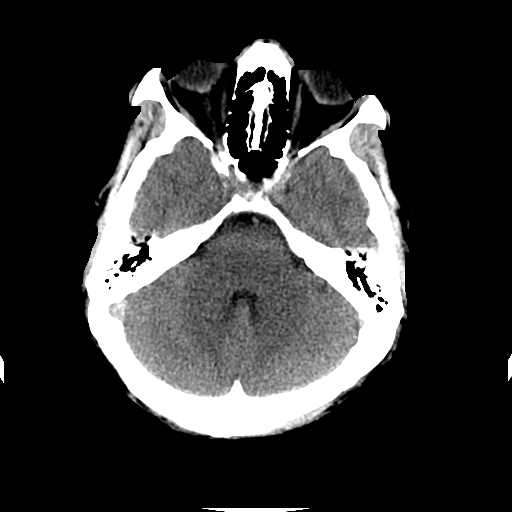
[im 10/34  brain]
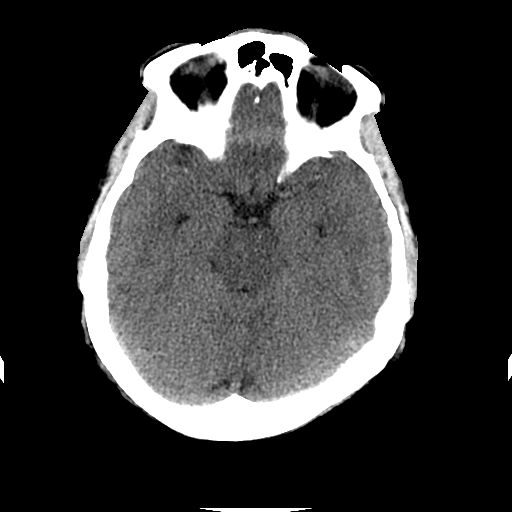
[im 12/34  brain]
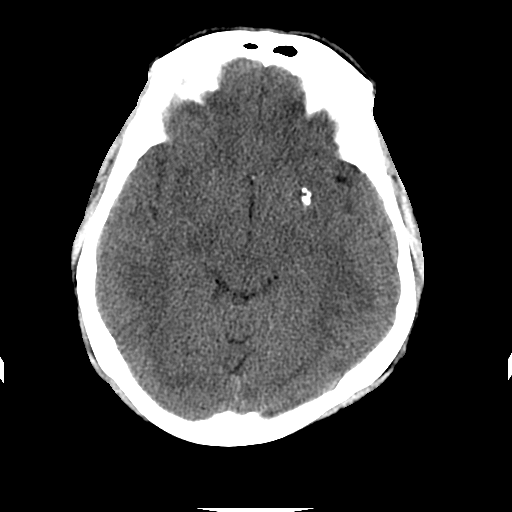
[im 12/34  bone]
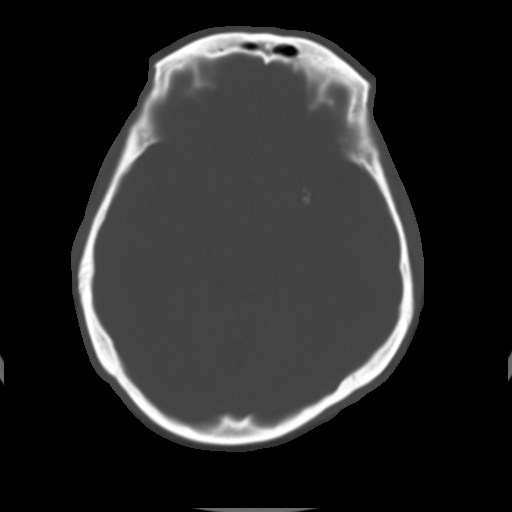
[im 15/34  brain]
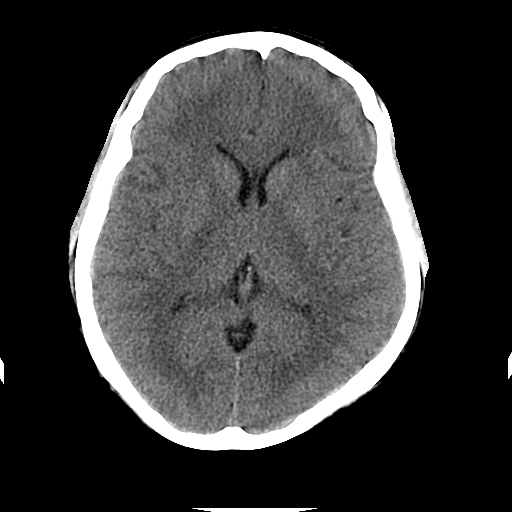
[im 17/34  brain]
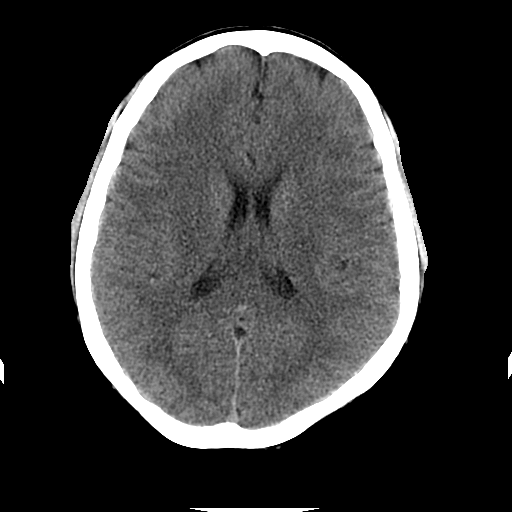
[im 19/34  brain]
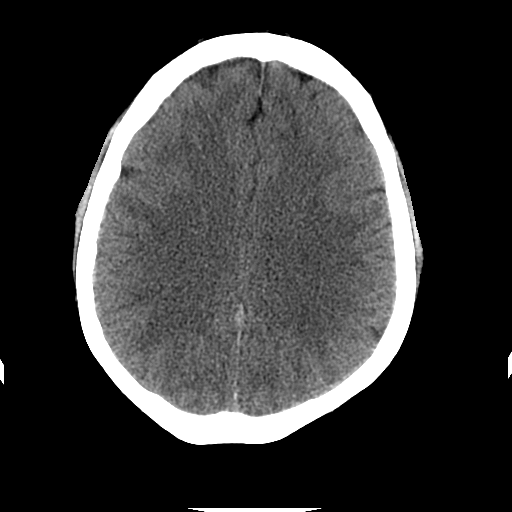
[im 22/34  brain]
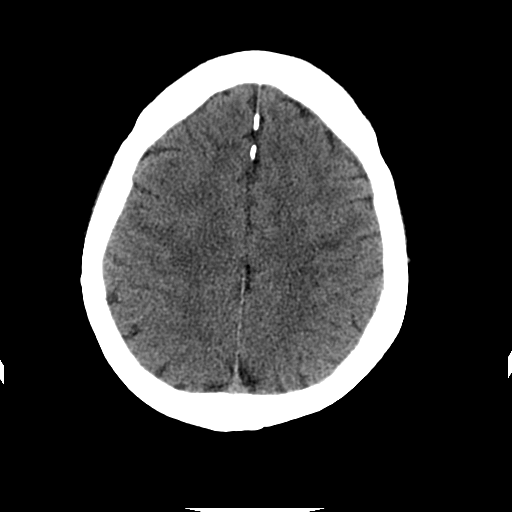
[im 22/34  bone]
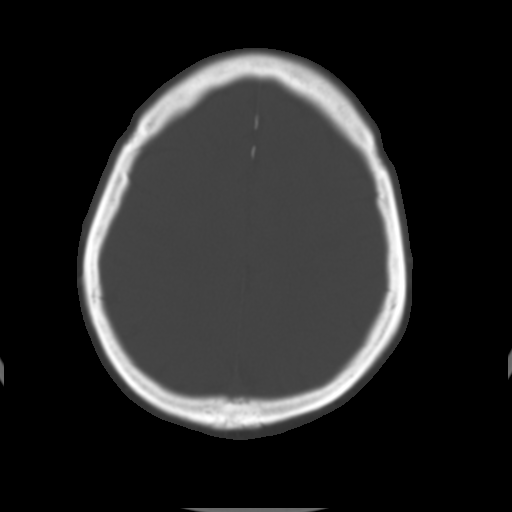
[im 24/34  brain]
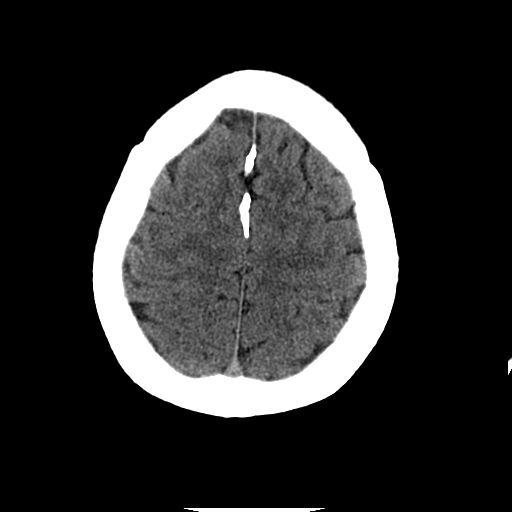
[im 26/34  brain]
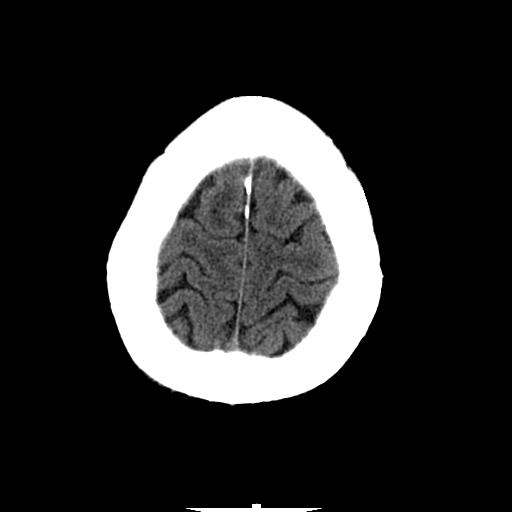
[im 29/34  brain]
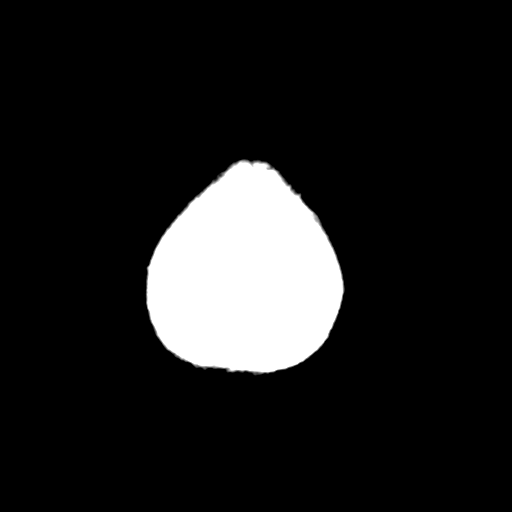
[im 31/34  brain]
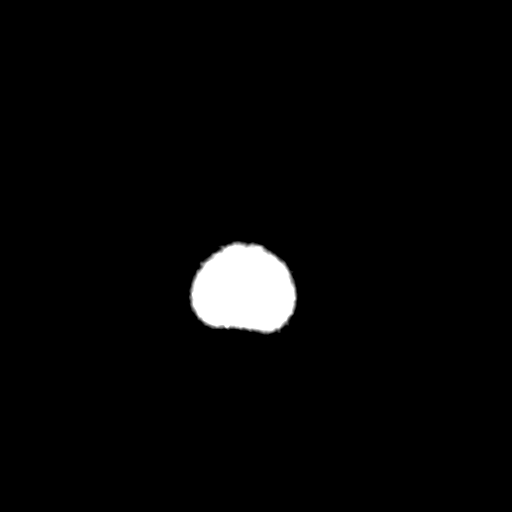
[im 31/34  bone]
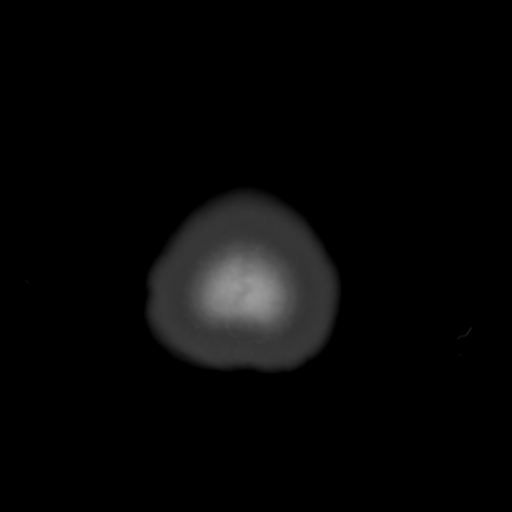

[Series 202: head w/o bone, idose (1) · axial · non-contrast · 0.44mm/px · z∈[+99,+124]mm · 2 of 34 slices shown]
[im 3/34  bone]
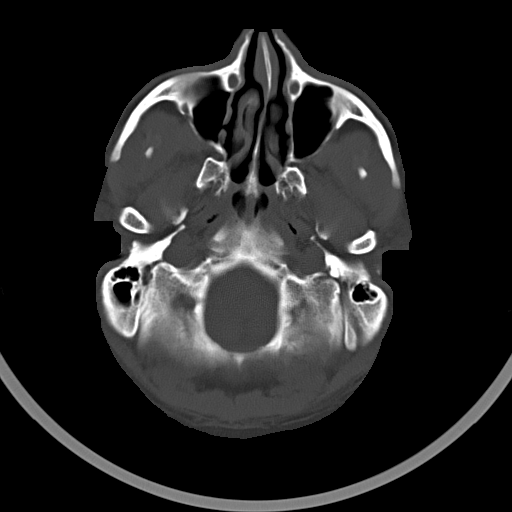
[im 8/34  bone]
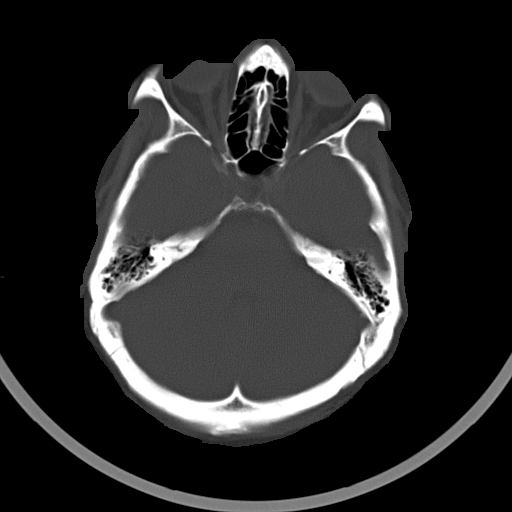

[15 of 30 positions shown; findings below may reference images not displayed]

FINDINGS: Scattered areas of low density throughout the subcortical white
matter. The gray-white differentiation along the right frontal lobe
on series 201, image 16 is slightly unusual and this could be
related to the white matter disease. There are small dystrophic
calcifications in the left basal ganglia region. These appear to be
separate from the left MCA M1 branch. Calcifications measure up to 7
mm. No evidence for acute hemorrhage, mass lesion, midline shift,
hydrocephalus or large area of infarct.

The paranasal sinuses are clear.  No acute bone abnormality.
IMPRESSION: No acute intracranial abnormality.

Dystrophic calcifications in the left basal ganglia region. These
calcifications are atypical and the location is not suggestive for
an aneurysm.

Patchy low density throughout the subcortical white matter
suggesting underlying white matter disease. Findings could represent
chronic small vessel ischemic disease but nonspecific.

## 2017-02-11 ENCOUNTER — Other Ambulatory Visit: Payer: Self-pay | Admitting: Cardiovascular Disease

## 2017-03-02 ENCOUNTER — Other Ambulatory Visit: Payer: Self-pay | Admitting: Cardiovascular Disease

## 2017-04-14 ENCOUNTER — Other Ambulatory Visit: Payer: BLUE CROSS/BLUE SHIELD | Admitting: *Deleted

## 2017-04-14 DIAGNOSIS — R748 Abnormal levels of other serum enzymes: Secondary | ICD-10-CM | POA: Diagnosis not present

## 2017-04-14 LAB — HEPATIC FUNCTION PANEL
ALT: 30 IU/L (ref 0–44)
AST: 24 IU/L (ref 0–40)
Albumin: 4 g/dL (ref 3.5–5.5)
Alkaline Phosphatase: 51 IU/L (ref 39–117)
Bilirubin Total: 0.5 mg/dL (ref 0.0–1.2)
Bilirubin, Direct: 0.14 mg/dL (ref 0.00–0.40)
Total Protein: 7.4 g/dL (ref 6.0–8.5)

## 2017-09-12 ENCOUNTER — Other Ambulatory Visit: Payer: Self-pay | Admitting: Cardiovascular Disease

## 2017-09-23 ENCOUNTER — Other Ambulatory Visit: Payer: Self-pay | Admitting: Cardiovascular Disease

## 2017-10-03 ENCOUNTER — Other Ambulatory Visit: Payer: Self-pay | Admitting: Cardiovascular Disease

## 2017-10-12 ENCOUNTER — Telehealth: Payer: Self-pay | Admitting: Cardiovascular Disease

## 2017-10-12 MED ORDER — CARVEDILOL 12.5 MG PO TABS: 12.5000 mg | ORAL_TABLET | Freq: Two times a day (BID) | ORAL | 2 refills | Status: DC

## 2017-10-12 MED ORDER — AMLODIPINE BESYLATE 5 MG PO TABS: 5.0000 mg | ORAL_TABLET | Freq: Every day | ORAL | 2 refills | Status: DC

## 2017-10-12 NOTE — Telephone Encounter (Signed)
°*  STAT* If patient is at the pharmacy, call can be transferred to refill team.   1. Which medications need to be refilled? (please list name of each medication and dose if known)amLODipine (NORVASC) 5 MG tablet(Expired) and carvedilol (COREG) 12.5 MG tablet  2. Which pharmacy/location (including street and city if local pharmacy) is medication to be sent to?  [facility] [phone] - [place], [address] AT [phone] (Phone    3. Do they need a 30 day or 90 day supply?   Patient says that he needs all of his meds refilled but those two are the ones he is out of

## 2017-10-12 NOTE — Telephone Encounter (Signed)
Pt's medication was sent to pt's pharmacy as requested. Confirmation received.  °

## 2017-10-29 ENCOUNTER — Other Ambulatory Visit: Payer: Self-pay | Admitting: Cardiovascular Disease

## 2017-11-20 ENCOUNTER — Encounter: Payer: Self-pay | Admitting: Cardiovascular Disease

## 2017-11-20 ENCOUNTER — Ambulatory Visit (INDEPENDENT_AMBULATORY_CARE_PROVIDER_SITE_OTHER): Payer: BLUE CROSS/BLUE SHIELD | Admitting: Cardiovascular Disease

## 2017-11-20 VITALS — BP 116/78 | HR 60 | Ht 71.0 in | Wt 233.6 lb

## 2017-11-20 DIAGNOSIS — I2510 Atherosclerotic heart disease of native coronary artery without angina pectoris: Secondary | ICD-10-CM

## 2017-11-20 DIAGNOSIS — R002 Palpitations: Secondary | ICD-10-CM | POA: Diagnosis not present

## 2017-11-20 NOTE — Progress Notes (Signed)
Cardiology Office Note Date:  11/20/2017   ID:  Alan Walker, DOB [birth year], MRN [id number]  PCP:  Patient, No Pcp Per  Cardiologist:  Sherren Mocha, MD    Chief Complaint  Patient presents with  . Coronary Artery Disease  . Palpitations     History of Present Illness: Alan Walker is a 57 y.o. male who presents for follow-up of coronary artery disease.  The patient initially presented in 2016 with an inferior wall STEMI.  At that time he was noted to have occlusion of the distal LAD with typical angiographic appearance of spontaneous coronary artery dissection.  Medical therapy was recommended.  The patient is here alone today.  He complains of palpitations that he is primarily feeling on the right side of his chest.  These occur at rest but also during the day.  He experiences this frequently.  He denies shortness of breath, chest pain, chest pressure, orthopnea, or PND.  He is compliant with his medications.   Past Medical History:  Diagnosis Date  . Dissection of coronary artery 10/20/2014   Distal LAD  . Dyslipidemia (high LDL; low HDL) 10/20/2014  . Hypertension   . ST elevation (STEMI) myocardial infarction involving left anterior descending coronary artery ([place]) 10/20/2014   a.  LHC 10/20/14:  LAD:  Mid LAD tapers abruptly in an area of tortuosity. It terminates in the distal anterior wall and is suspected to be totally occluded in that area - Typical appearance of a spontaneous coronary artery dissection with intramural hematoma present. EF 50-55% with inf-apical AK;  b. Echo 10/22/14:  EF 55-60%, Gr 1 DD  . Ventricular fibrillation ([place]) 10/20/2014   in the setting of acute STEMI    Past Surgical History:  Procedure Laterality Date  . LEFT HEART CATHETERIZATION WITH CORONARY ANGIOGRAM N/A 10/20/2014   Procedure: LEFT HEART CATHETERIZATION WITH CORONARY ANGIOGRAM;  Surgeon: Blane Ohara, MD; L main [place], LAD 100% distal (dissection), CFX 0%, RCA 0%, EF 50-55%      Current Outpatient Medications  Medication Sig Dispense Refill  . amLODipine (NORVASC) 5 MG tablet Take 1 tablet (5 mg total) by mouth daily. Please keep upcoming appt in March for future refills. Thank you 30 tablet 2  . aspirin 81 MG tablet Take 1 tablet (81 mg total) by mouth daily.    Marland Kitchen atorvastatin (LIPITOR) 20 MG tablet TAKE 1 TABLET BY MOUTH DAILY AT 6 PM 30 tablet 0  . carvedilol (COREG) 12.5 MG tablet Take 1 tablet (12.5 mg total) by mouth 2 (two) times daily with a meal. Please keep upcoming appt for March for future refills. Thank you 30 tablet 2  . nitroGLYCERIN (NITROSTAT) 0.4 MG SL tablet Place 1 tablet (0.4 mg total) under the tongue every 5 (five) minutes as needed for chest pain. 25 tablet 3   No current facility-administered medications for this visit.     Allergies:   Patient has no known allergies.   Social History:  The patient  reports that  has never smoked. he has never used smokeless tobacco. He reports that he does not drink alcohol or use drugs.   Family History:  The patient's family history includes Asthma in his mother and son; Hypertension in his mother; Stroke in his brother.    ROS:  Please see the history of present illness. All other systems are reviewed and negative.    PHYSICAL EXAM: VS:  BP 116/78   Pulse 60   Ht 5\' 11"  (1.803  m)   Wt 233 lb 9.6 oz (106 kg)   BMI 32.58 kg/m  , BMI Body mass index is 32.58 kg/m. GEN: Well nourished, well developed, in no acute distress  HEENT: normal  Neck: no JVD, no masses. No carotid bruits Cardiac: RRR without murmur or gallop                Respiratory:  clear to auscultation bilaterally, normal work of breathing GI: soft, nontender, nondistended, + BS MS: no deformity or atrophy  Ext: no pretibial edema, pedal pulses 2+= bilaterally Skin: warm and dry, no rash Neuro:  Strength and sensation are intact Psych: euthymic mood, full affect  EKG:  EKG is ordered today. The ekg ordered today shows  sinus rhythm with PACs, heart rate 60 bpm, age-indeterminate inferior infarct.  Recent Labs: 04/14/2017: ALT 30   Lipid Panel     Component Value Date/Time   CHOL 146 10/15/2016 0801   TRIG 61 10/15/2016 0801   HDL 48 10/15/2016 0801   CHOLHDL 3.0 10/15/2016 0801   CHOLHDL 3 11/24/2014 0835   VLDL 16.6 11/24/2014 0835   LDLCALC 86 10/15/2016 0801      Wt Readings from Last 3 Encounters:  11/20/17 233 lb 9.6 oz (106 kg)  10/15/16 220 lb 12 oz (100.1 kg)  09/11/16 220 lb (99.8 kg)     Cardiac Studies Reviewed: Echo 10/22/2014: Study Conclusions  - Left ventricle: The cavity size was normal. Wall thickness was normal. Systolic function was normal. The estimated ejection fraction was in the range of 55% to 60%. Although no diagnostic regional wall motion abnormality was identified, this possibility cannot be completely excluded on the basis of this study. Doppler parameters are consistent with abnormal left ventricular relaxation (grade 1 diastolic dysfunction). - Aortic valve: There was trivial regurgitation. Valve area (Vmax): 3.25 cm^2.  ASSESSMENT AND PLAN: 1.  Heart palpitations: Unclear etiology, possibly PACs which are seen on his twelve-lead EKG today.  Recommend a 48-hour Holter monitor.  2.  Coronary artery disease, native vessel, without angina: The patient is on appropriate medical therapy.  His medications are reviewed today and include a beta-blocker, aspirin, and a statin drug.  3.  Hyperlipidemia: Treated with atorvastatin 20 mg daily.  Labs will be updated.  I reviewed his last lipids.  He has had mildly elevated LFTs and these will be rechecked.  4.  Hypertension: Patient is treated with amlodipine and carvedilol with well controlled blood pressure.  The patient is stable from a cardiac perspective.  Will order a 48-hour Holter monitor to assess palpitations.  He asks questions about health screening and colonoscopy.  I have recommended that he  find a primary care physician and he is given contact information to find a PCP.  Current medicines are reviewed with the patient today.  The patient does not have concerns regarding medicines.  Labs/ tests ordered today include:   Orders Placed This Encounter  Procedures  . CBC with Differential/Platelet  . Comprehensive metabolic panel  . Lipid panel  . HOLTER MONITOR - 48 HOUR  . EKG 12-Lead    Disposition:   FU one year  Signed, Sherren Mocha, MD  11/20/2017 1:36 PM    [facility] [address], [place], [place]  [id number] Phone: [phone]; Fax: [phone]

## 2017-11-20 NOTE — Patient Instructions (Signed)
Medication Instructions:  Your provider recommends that you continue on your current medications as directed. Please refer to the Current Medication list given to you today.    Labwork: Your provider recommends that you return for FASTING lab work.    Testing/Procedures: Your physician has recommended that you wear a holter monitor. Holter monitors are medical devices that record the heart's electrical activity. Doctors most often use these monitors to diagnose arrhythmias. Arrhythmias are problems with the speed or rhythm of the heartbeat. The monitor is a small, portable device. You can wear one while you do your normal daily activities. This is usually used to diagnose what is causing palpitations/syncope (passing out).  Follow-Up: Your provider wants you to follow-up in: 1 year with Dr. Burt Knack. You will receive a reminder letter in the mail two months in advance. If you don't receive a letter, please call our office to schedule the follow-up appointment.    Any Other Special Instructions Will Be Listed Below (If Applicable).     If you need a refill on your cardiac medications before your next appointment, please call your pharmacy.

## 2017-11-27 ENCOUNTER — Other Ambulatory Visit: Payer: Self-pay | Admitting: Cardiovascular Disease

## 2017-12-02 ENCOUNTER — Other Ambulatory Visit: Payer: Self-pay | Admitting: Cardiovascular Disease

## 2017-12-03 ENCOUNTER — Ambulatory Visit (INDEPENDENT_AMBULATORY_CARE_PROVIDER_SITE_OTHER): Payer: BLUE CROSS/BLUE SHIELD

## 2017-12-03 ENCOUNTER — Other Ambulatory Visit: Payer: BLUE CROSS/BLUE SHIELD

## 2017-12-03 DIAGNOSIS — R002 Palpitations: Secondary | ICD-10-CM

## 2017-12-03 DIAGNOSIS — I2510 Atherosclerotic heart disease of native coronary artery without angina pectoris: Secondary | ICD-10-CM

## 2017-12-03 LAB — COMPREHENSIVE METABOLIC PANEL
ALT: 17 IU/L (ref 0–44)
AST: 18 IU/L (ref 0–40)
Albumin/Globulin Ratio: 1.3 (ref 1.2–2.2)
Albumin: 4.1 g/dL (ref 3.5–5.5)
Alkaline Phosphatase: 48 IU/L (ref 39–117)
BUN/Creatinine Ratio: 13 (ref 9–20)
BUN: 12 mg/dL (ref 6–24)
Bilirubin Total: 0.6 mg/dL (ref 0.0–1.2)
CO2: 24 mmol/L (ref 20–29)
Calcium: 8.8 mg/dL (ref 8.7–10.2)
Chloride: 105 mmol/L (ref 96–106)
Creatinine, Ser: 0.93 mg/dL (ref 0.76–1.27)
GFR calc Af Amer: 106 mL/min/{1.73_m2} (ref >59)
GFR calc non Af Amer: 91 mL/min/{1.73_m2} (ref >59)
Globulin, Total: 3.2 g/dL (ref 1.5–4.5)
Glucose: 106 mg/dL — ABNORMAL HIGH (ref 65–99)
Potassium: 4 mmol/L (ref 3.5–5.2)
Sodium: 142 mmol/L (ref 134–144)
Total Protein: 7.3 g/dL (ref 6.0–8.5)

## 2017-12-03 LAB — CBC WITH DIFFERENTIAL/PLATELET
Basophils Absolute: 0 10*3/uL (ref 0.0–0.2)
Basos: 1 %
EOS (ABSOLUTE): 0.2 10*3/uL (ref 0.0–0.4)
Eos: 5 %
Hematocrit: 35.7 % — ABNORMAL LOW (ref 37.5–51.0)
Hemoglobin: 12.2 g/dL — ABNORMAL LOW (ref 13.0–17.7)
Immature Grans (Abs): 0 10*3/uL (ref 0.0–0.1)
Immature Granulocytes: 0 %
Lymphocytes Absolute: 2.5 10*3/uL (ref 0.7–3.1)
Lymphs: 56 %
MCH: 31.4 pg (ref 26.6–33.0)
MCHC: 34.2 g/dL (ref 31.5–35.7)
MCV: 92 fL (ref 79–97)
Monocytes Absolute: 0.5 10*3/uL (ref 0.1–0.9)
Monocytes: 11 %
Neutrophils Absolute: 1.2 10*3/uL — ABNORMAL LOW (ref 1.4–7.0)
Neutrophils: 27 %
Platelets: 286 10*3/uL (ref 150–379)
RBC: 3.88 x10E6/uL — ABNORMAL LOW (ref 4.14–5.80)
RDW: 12.2 % — ABNORMAL LOW (ref 12.3–15.4)
WBC: 4.4 10*3/uL (ref 3.4–10.8)

## 2017-12-03 LAB — LIPID PANEL
Chol/HDL Ratio: 3 ratio (ref 0.0–5.0)
Cholesterol, Total: 134 mg/dL (ref 100–199)
HDL: 44 mg/dL (ref >39)
LDL Calculated: 78 mg/dL (ref 0–99)
Triglycerides: 59 mg/dL (ref 0–149)
VLDL Cholesterol Cal: 12 mg/dL (ref 5–40)

## 2018-01-01 ENCOUNTER — Other Ambulatory Visit: Payer: Self-pay | Admitting: Cardiovascular Disease

## 2018-02-20 ENCOUNTER — Ambulatory Visit
Admission: EM | Admit: 2018-02-20 | Discharge: 2018-02-20 | Discharge status: Against Medical Advice | Payer: Medicaid Other | Attending: NURSE PRACTITIONER | Admitting: NURSE PRACTITIONER

## 2018-02-20 ENCOUNTER — Ambulatory Visit (HOSPITAL_COMMUNITY): Payer: Medicaid Other | Admitting: Emergency Medicine

## 2018-02-20 ENCOUNTER — Encounter (HOSPITAL_COMMUNITY): Payer: Self-pay

## 2018-02-20 DIAGNOSIS — F17210 Nicotine dependence, cigarettes, uncomplicated: Secondary | ICD-10-CM | POA: Insufficient documentation

## 2018-02-20 DIAGNOSIS — L03114 Cellulitis of left upper limb: Secondary | ICD-10-CM | POA: Insufficient documentation

## 2018-02-20 DIAGNOSIS — L0390 Cellulitis, unspecified: Secondary | ICD-10-CM

## 2018-02-20 MED ORDER — CEFTRIAXONE IN LIDOCAINE 1% IM INJECTION - JMH
1000.00 mg | INJECTION | INTRAMUSCULAR | Status: DC
Start: 2018-02-20 — End: 2018-02-20

## 2018-02-20 NOTE — ED Triage Notes (Signed)
L inner elbow, warm to touch. Reports was moving pipe outside yesterday unsure of injury. Redness and swelling with pain began last night.

## 2018-02-20 NOTE — ED Nurses Note (Signed)
Pt eloped declined waiting for injection pt spoke to E. Rice NP.

## 2018-02-20 NOTE — ED Provider Notes (Signed)
[place]      Chief Complaint:    Patient is a 57 y.o.  male [facility]   Chief Complaint   Patient presents with   . Arm swelling       History of Present Illness:  Presents with redness to the left inner arm that started this morning.  Patient states he was carrying pipe yesterday and scratched himself in that area.  Patient denies any fever, chills, streaking, or drainage.  Patient denies any treatment to this point.  Patient denies any previous issues like this.       Review of Systems:    Constitutional: No fever, chills  Skin: No rashes or lesions  Psych: Normal mood    All other symptoms reviewed and are negative, unless commented on in the HPI.     Past Medical History:    History reviewed. No pertinent past medical history.  History reviewed. No pertinent surgical history.  No family history on file.    Above history reviewed with patient.  Allergies, medication list, and old records also reviewed.     Social History:    Social History     Tobacco Use   . Smoking status: Current Every Day Smoker   . Smokeless tobacco: Current User     Types: Chew   Substance Use Topics   . Alcohol use: Yes     Alcohol/week: 2.4 oz     Types: 4 Cans of beer per week   . Drug use: Never       Filed Vitals:    02/20/18 1600   BP: (!) 137/92   Pulse: 100   Resp: 20   Temp: 36.9 C (98.4 F)   SpO2: 95%       Physical Exam:     Nursing note and vitals reviewed.  Vital signs reviewed as above. No acute distress.   Constitutional: Pt is well-developed and well-nourished.   Head: Normocephalic and atraumatic.   Eyes: Pupils are equal, round, and reactive to light. EOM are intact  Musculoskeletal: Normal range of motion. No deformities.  Exhibits no edema and no tenderness.   Neurological: CNs 2-12 grossly intact.  No focal deficits noted.  Normal gait and balance   Skin: Warm and dry.  Moderate erythema noted to the medial aspect of the left arm, no induration, no fluctuance, small abrasions  noted. Turgor normal  Psychiatric: Patient has a normal mood and affect.    Labs:    No results found for any visits on 02/20/18.    Imaging:         Orders Placed This Encounter   . cefTRIAXone (ROCEPHIN) 350 mg/mL in lidocaine 1% IM injection            MDM:   Ordered Rocephin    Patient came out of room requesting to leave.  Informed patient he needs to stay to receive antibiotic therapy.  Patient refused.  Patient left department prior to be instructed on risk.    Impression:   Encounter Diagnosis   Name Primary?   . Cellulitis, unspecified cellulitis site Yes       Disposition: Eloped      Chart was dictated using voice recognition software, which may lead to minor grammatical or syntax errors.

## 2018-04-05 DIAGNOSIS — N486 Induration penis plastica: Secondary | ICD-10-CM | POA: Diagnosis not present

## 2018-04-05 DIAGNOSIS — Z125 Encounter for screening for malignant neoplasm of prostate: Secondary | ICD-10-CM | POA: Diagnosis not present

## 2018-07-25 ENCOUNTER — Other Ambulatory Visit: Payer: Self-pay | Admitting: Cardiovascular Disease

## 2018-09-30 DIAGNOSIS — Z125 Encounter for screening for malignant neoplasm of prostate: Secondary | ICD-10-CM | POA: Diagnosis not present

## 2018-09-30 DIAGNOSIS — N486 Induration penis plastica: Secondary | ICD-10-CM | POA: Diagnosis not present

## 2018-10-04 ENCOUNTER — Other Ambulatory Visit: Payer: Self-pay | Admitting: Urology

## 2018-10-08 ENCOUNTER — Other Ambulatory Visit: Payer: Self-pay

## 2018-10-08 ENCOUNTER — Encounter (HOSPITAL_BASED_OUTPATIENT_CLINIC_OR_DEPARTMENT_OTHER): Payer: Self-pay | Admitting: *Deleted

## 2018-10-08 NOTE — Progress Notes (Signed)
Spoke w/ pt via phone for pre-op interview.  Npo after mn w/ exception clear liquids until 0700 (no cream/ milk products).  Arrive at 1100.  Needs istat.  Current ekg in chart and epic.  Pt cardiologist, dr Burt Knack, lov note dated 11-20-2017 in epic and chart.

## 2018-10-15 ENCOUNTER — Encounter (HOSPITAL_BASED_OUTPATIENT_CLINIC_OR_DEPARTMENT_OTHER): Payer: Self-pay | Admitting: Emergency Medicine

## 2018-10-15 ENCOUNTER — Ambulatory Visit (HOSPITAL_BASED_OUTPATIENT_CLINIC_OR_DEPARTMENT_OTHER)
Admission: RE | Admit: 2018-10-15 | Discharge: 2018-10-15 | Disposition: A | Discharge status: Home / Self Care | Payer: BLUE CROSS/BLUE SHIELD | Attending: Urology | Admitting: Urology

## 2018-10-15 ENCOUNTER — Other Ambulatory Visit: Payer: Self-pay

## 2018-10-15 ENCOUNTER — Encounter (HOSPITAL_BASED_OUTPATIENT_CLINIC_OR_DEPARTMENT_OTHER)
Admission: RE | Disposition: A | Discharge status: Home / Self Care | Payer: Self-pay | Source: Home / Self Care | Attending: Urology

## 2018-10-15 ENCOUNTER — Ambulatory Visit (HOSPITAL_BASED_OUTPATIENT_CLINIC_OR_DEPARTMENT_OTHER): Payer: BLUE CROSS/BLUE SHIELD | Admitting: Anesthesiology

## 2018-10-15 DIAGNOSIS — Z79899 Other long term (current) drug therapy: Secondary | ICD-10-CM | POA: Diagnosis not present

## 2018-10-15 DIAGNOSIS — I252 Old myocardial infarction: Secondary | ICD-10-CM | POA: Diagnosis not present

## 2018-10-15 DIAGNOSIS — E785 Hyperlipidemia, unspecified: Secondary | ICD-10-CM | POA: Diagnosis not present

## 2018-10-15 DIAGNOSIS — N486 Induration penis plastica: Secondary | ICD-10-CM | POA: Diagnosis not present

## 2018-10-15 DIAGNOSIS — I10 Essential (primary) hypertension: Secondary | ICD-10-CM | POA: Insufficient documentation

## 2018-10-15 DIAGNOSIS — I2510 Atherosclerotic heart disease of native coronary artery without angina pectoris: Secondary | ICD-10-CM | POA: Diagnosis not present

## 2018-10-15 DIAGNOSIS — E876 Hypokalemia: Secondary | ICD-10-CM | POA: Diagnosis not present

## 2018-10-15 HISTORY — DX: Induration penis plastica: N48.6

## 2018-10-15 HISTORY — DX: Atherosclerotic heart disease of native coronary artery without angina pectoris: I25.10

## 2018-10-15 HISTORY — DX: Old myocardial infarction: I25.2

## 2018-10-15 HISTORY — PX: NESBIT PROCEDURE: SHX2087

## 2018-10-15 HISTORY — DX: Personal history of other medical treatment: Z92.89

## 2018-10-15 HISTORY — DX: Palpitations: R00.2

## 2018-10-15 HISTORY — DX: Presence of spectacles and contact lenses: Z97.3

## 2018-10-15 LAB — POCT I-STAT 4, (NA,K, GLUC, HGB,HCT)
Glucose, Bld: 97 mg/dL (ref 70–99)
HCT: 36 % — ABNORMAL LOW (ref 39.0–52.0)
Hemoglobin: 12.2 g/dL — ABNORMAL LOW (ref 13.0–17.0)
Potassium: 4.3 mmol/L (ref 3.5–5.1)
Sodium: 142 mmol/L (ref 135–145)

## 2018-10-15 SURGERY — NESBIT PROCEDURE
Anesthesia: General | Site: Penis

## 2018-10-15 MED ORDER — LIDOCAINE 2% (20 MG/ML) 5 ML SYRINGE
INTRAMUSCULAR | Status: AC
  Filled 2018-10-15: qty 5

## 2018-10-15 MED ORDER — PROPOFOL 10 MG/ML IV BOLUS
INTRAVENOUS | Status: AC
  Filled 2018-10-15: qty 20

## 2018-10-15 MED ORDER — LACTATED RINGERS IV SOLN
INTRAVENOUS | Status: DC
  Administered 2018-10-15 (×3): via INTRAVENOUS
  Filled 2018-10-15: qty 1000

## 2018-10-15 MED ORDER — DEXAMETHASONE SODIUM PHOSPHATE 4 MG/ML IJ SOLN
INTRAMUSCULAR | Status: DC | PRN
  Administered 2018-10-15: 10 mg via INTRAVENOUS

## 2018-10-15 MED ORDER — OXYCODONE HCL 5 MG/5ML PO SOLN
5.0000 mg | Freq: Once | ORAL | Status: DC | PRN
  Filled 2018-10-15: qty 5

## 2018-10-15 MED ORDER — FENTANYL CITRATE (PF) 100 MCG/2ML IJ SOLN
INTRAMUSCULAR | Status: DC | PRN
  Administered 2018-10-15 (×3): 50 ug via INTRAVENOUS

## 2018-10-15 MED ORDER — ONDANSETRON HCL 4 MG/2ML IJ SOLN
INTRAMUSCULAR | Status: DC | PRN
  Administered 2018-10-15: 4 mg via INTRAVENOUS

## 2018-10-15 MED ORDER — TRAMADOL HCL 50 MG PO TABS: 50.0000 mg | ORAL_TABLET | Freq: Four times a day (QID) | ORAL | 0 refills | Status: DC | PRN

## 2018-10-15 MED ORDER — MIDAZOLAM HCL 2 MG/2ML IJ SOLN
INTRAMUSCULAR | Status: AC
  Filled 2018-10-15: qty 2

## 2018-10-15 MED ORDER — ONDANSETRON HCL 4 MG/2ML IJ SOLN
INTRAMUSCULAR | Status: AC
  Filled 2018-10-15: qty 2

## 2018-10-15 MED ORDER — PROMETHAZINE HCL 25 MG/ML IJ SOLN
6.2500 mg | INTRAMUSCULAR | Status: DC | PRN
  Filled 2018-10-15: qty 1

## 2018-10-15 MED ORDER — FENTANYL CITRATE (PF) 100 MCG/2ML IJ SOLN
INTRAMUSCULAR | Status: AC
  Filled 2018-10-15: qty 2

## 2018-10-15 MED ORDER — SODIUM CHLORIDE 0.9 % IV SOLN
INTRAVENOUS | Status: AC | PRN
  Administered 2018-10-15: 200 mL via INTRAMUSCULAR

## 2018-10-15 MED ORDER — PROPOFOL 10 MG/ML IV BOLUS
INTRAVENOUS | Status: DC | PRN
  Administered 2018-10-15: 150 mg via INTRAVENOUS

## 2018-10-15 MED ORDER — CLINDAMYCIN PHOSPHATE 900 MG/50ML IV SOLN
900.0000 mg | INTRAVENOUS | Status: AC
  Administered 2018-10-15: 900 mg via INTRAVENOUS
  Filled 2018-10-15: qty 50

## 2018-10-15 MED ORDER — SODIUM CHLORIDE 0.9 % IR SOLN
Status: DC | PRN
  Administered 2018-10-15: 500 mL

## 2018-10-15 MED ORDER — OXYCODONE HCL 5 MG PO TABS
5.0000 mg | ORAL_TABLET | Freq: Once | ORAL | Status: DC | PRN
  Filled 2018-10-15: qty 1

## 2018-10-15 MED ORDER — CLINDAMYCIN PHOSPHATE 900 MG/50ML IV SOLN
INTRAVENOUS | Status: AC
  Filled 2018-10-15: qty 50

## 2018-10-15 MED ORDER — MIDAZOLAM HCL 5 MG/5ML IJ SOLN
INTRAMUSCULAR | Status: DC | PRN
  Administered 2018-10-15: 2 mg via INTRAVENOUS

## 2018-10-15 MED ORDER — BUPIVACAINE HCL (PF) 0.25 % IJ SOLN
INTRAMUSCULAR | Status: DC | PRN
  Administered 2018-10-15: 10 mL

## 2018-10-15 MED ORDER — DEXAMETHASONE SODIUM PHOSPHATE 10 MG/ML IJ SOLN
INTRAMUSCULAR | Status: AC
  Filled 2018-10-15: qty 1

## 2018-10-15 MED ORDER — LIDOCAINE 2% (20 MG/ML) 5 ML SYRINGE
INTRAMUSCULAR | Status: DC | PRN
  Administered 2018-10-15: 100 mg via INTRAVENOUS

## 2018-10-15 MED ORDER — FENTANYL CITRATE (PF) 100 MCG/2ML IJ SOLN
25.0000 ug | INTRAMUSCULAR | Status: DC | PRN
  Filled 2018-10-15: qty 1

## 2018-10-15 MED ORDER — PHENYLEPHRINE 40 MCG/ML (10ML) SYRINGE FOR IV PUSH (FOR BLOOD PRESSURE SUPPORT)
PREFILLED_SYRINGE | INTRAVENOUS | Status: AC
  Filled 2018-10-15: qty 10

## 2018-10-15 MED ORDER — KETOROLAC TROMETHAMINE 30 MG/ML IJ SOLN
30.0000 mg | Freq: Once | INTRAMUSCULAR | Status: DC | PRN
  Filled 2018-10-15: qty 1

## 2018-10-15 MED ORDER — PHENYLEPHRINE 40 MCG/ML (10ML) SYRINGE FOR IV PUSH (FOR BLOOD PRESSURE SUPPORT)
PREFILLED_SYRINGE | INTRAVENOUS | Status: DC | PRN
  Administered 2018-10-15: 80 ug via INTRAVENOUS
  Administered 2018-10-15 (×2): 120 ug via INTRAVENOUS

## 2018-10-15 SURGICAL SUPPLY — 31 items
BANDAGE CO FLEX L/F 1IN X 5YD (GAUZE/BANDAGES/DRESSINGS) IMPLANT
BANDAGE CO FLEX L/F 2IN X 5YD (GAUZE/BANDAGES/DRESSINGS) ×1 IMPLANT
BLADE CLIPPER SURG (BLADE) ×1 IMPLANT
BLADE HEX COATED 2.75 (ELECTRODE) ×2 IMPLANT
BLADE SURG 15 STRL LF DISP TIS (BLADE) ×1 IMPLANT
BLADE SURG 15 STRL SS (BLADE) ×2
BNDG CONFORM 2 STRL LF (GAUZE/BANDAGES/DRESSINGS) ×1 IMPLANT
COVER BACK TABLE 60X90IN (DRAPES) ×1 IMPLANT
COVER MAYO STAND STRL (DRAPES) ×1 IMPLANT
DRAIN PENROSE 18X1/4 LTX STRL (WOUND CARE) ×2 IMPLANT
DRAPE LAPAROTOMY 100X72 PEDS (DRAPES) ×2 IMPLANT
ELECT REM PT RETURN 9FT ADLT (ELECTROSURGICAL) ×2
ELECTRODE REM PT RTRN 9FT ADLT (ELECTROSURGICAL) ×1 IMPLANT
GLOVE BIO SURGEON STRL SZ7.5 (GLOVE) ×2 IMPLANT
GOWN STRL REUS W/TWL LRG LVL3 (GOWN DISPOSABLE) ×4 IMPLANT
IV NS 250ML (IV SOLUTION) ×2
IV NS 250ML BAXH (IV SOLUTION) ×1 IMPLANT
KIT TURNOVER CYSTO (KITS) ×2 IMPLANT
NS IRRIG 500ML POUR BTL (IV SOLUTION) ×2 IMPLANT
PACK BASIN DAY SURGERY FS (CUSTOM PROCEDURE TRAY) ×2 IMPLANT
PENCIL BUTTON HOLSTER BLD 10FT (ELECTRODE) ×2 IMPLANT
SUPPORT SCROTAL LG STRP (MISCELLANEOUS) IMPLANT
SUT CHROMIC 3 0 SH 27 (SUTURE) ×2 IMPLANT
SUT ETHIBOND 2 OS 4 DA (SUTURE) ×5 IMPLANT
SUT VIC AB 3-0 SH 27 (SUTURE) ×2
SUT VIC AB 3-0 SH 27X BRD (SUTURE) IMPLANT
SUT VIC AB 3-0 SH 27XBRD (SUTURE) IMPLANT
SYR 10ML LL (SYRINGE) ×4 IMPLANT
SYR 50ML LL SCALE MARK (SYRINGE) ×2 IMPLANT
SYR CONTROL 10ML LL (SYRINGE) ×2 IMPLANT
TOWEL OR 17X24 6PK STRL BLUE (TOWEL DISPOSABLE) ×4 IMPLANT

## 2018-10-15 NOTE — Anesthesia Preprocedure Evaluation (Signed)
Anesthesia Evaluation  Patient identified by MRN, date of birth, ID band Patient awake    Reviewed: Allergy & Precautions, NPO status , Patient's Chart, lab work & pertinent test results  Airway Mallampati: II  TM Distance: >3 FB Neck ROM: Full    Dental no notable dental hx.    Pulmonary neg pulmonary ROS,    Pulmonary exam normal breath sounds clear to auscultation       Cardiovascular hypertension, Pt. on medications + CAD and + Past MI  Normal cardiovascular exam Rhythm:Regular Rate:Normal     Neuro/Psych negative neurological ROS  negative psych ROS   GI/Hepatic negative GI ROS, Neg liver ROS,   Endo/Other  negative endocrine ROS  Renal/GU negative Renal ROS  negative genitourinary   Musculoskeletal negative musculoskeletal ROS (+)   Abdominal   Peds negative pediatric ROS (+)  Hematology negative hematology ROS (+)   Anesthesia Other Findings   Reproductive/Obstetrics negative OB ROS                             Anesthesia Physical Anesthesia Plan  ASA: III  Anesthesia Plan: General   Post-op Pain Management:    Induction: Intravenous  PONV Risk Score and Plan: 2 and Ondansetron, Dexamethasone and Treatment may vary due to age or medical condition  Airway Management Planned: LMA  Additional Equipment:   Intra-op Plan:   Post-operative Plan: Extubation in OR  Informed Consent: I have reviewed the patients History and Physical, chart, labs and discussed the procedure including the risks, benefits and alternatives for the proposed anesthesia with the patient or authorized representative who has indicated his/her understanding and acceptance.     Dental advisory given  Plan Discussed with: CRNA and Surgeon  Anesthesia Plan Comments:         Anesthesia Quick Evaluation

## 2018-10-15 NOTE — H&P (Signed)
Alan Walker is an 58 y.o. male.    Chief Complaint: Pre-OP Penile Plication  HPI:   1- Peyronie's Disease - Pt with about 45 degrees ventral curvature, slowly progressive for over a year. Erections preserved. AFter 64months, curvature is completley stable and remains quite botherseom as penis often "keeps popping out" with coitus. Stable after 43mos+ observation.    PMH sig for CAD/MI at age 29 (follows [facility] and compliant).   Today " Alan Walker " is seen to proceed with penile plication for peyronies.    Past Medical History:  Diagnosis Date  . CAD (coronary artery disease) cardiologist-  dr Burt Knack   10-20-2014 cardiac cath--  occluded distal LAD typical appearance of spontaneous dissection with intramural hematoma present, ef 50-55% with inferior-apical AK  . Dyslipidemia (high LDL; low HDL) 10/20/2014  . History of exercise stress test    ETT 10-11-2015 normal  . History of ST elevation myocardial infarction (STEMI) 10/20/2014,  EMS shocked pt x1 for VFib   inferior wall per cardic cath --  occlusion distal LAD with typical angiographic appearance of spontaneous coronary arter dissection,  medical therapy recommended  . Hypertension   . Palpitations    event monitor 12-03-2017 rare PVCs, PACs with short SVT runs,  per dr cooper result note, benign  . Peyronie's disease   . Wears glasses     Past Surgical History:  Procedure Laterality Date  . LEFT HEART CATHETERIZATION WITH CORONARY ANGIOGRAM N/A 10/20/2014   Procedure: LEFT HEART CATHETERIZATION WITH CORONARY ANGIOGRAM;  Surgeon: Blane Ohara, MD; L main [place], LAD 100% distal (dissection), CFX 0%, RCA 0%, EF 50-55%   . NO PAST SURGERIES      Family History  Problem Relation Age of Onset  . Hypertension Mother   . Asthma Mother   . Asthma Son   . Stroke Brother   . Heart attack Neg Hx    Social History:  reports that he has never smoked. He has never used smokeless tobacco. He reports that he does not drink  alcohol or use drugs.  Allergies: No Known Allergies  No medications prior to admission.    No results found for this or any previous visit (from the past 48 hour(s)). No results found.  Review of Systems  Constitutional: Negative.  Negative for chills and fever.  HENT: Negative.   Eyes: Negative.   Respiratory: Negative.   Cardiovascular: Negative.  Negative for chest pain.  Gastrointestinal: Negative.   Genitourinary: Negative.   Musculoskeletal: Negative.   Skin: Negative.   Neurological: Negative.   Endo/Heme/Allergies: Negative.   Psychiatric/Behavioral: Negative.     There were no vitals taken for this visit. Physical Exam  Constitutional: He appears well-developed.  HENT:  Head: Normocephalic.  Eyes: Pupils are equal, round, and reactive to light.  Neck: Normal range of motion.  Cardiovascular: Normal rate.  Respiratory: Effort normal.  GI: Soft.  Genitourinary:    Genitourinary Comments: Stable large ventral penile plaque   Neurological: He is alert.  Skin: Skin is warm.  Psychiatric: He has a normal mood and affect.     Assessment/Plan  Proceed as planned with penile plication. Risks, benefits, alternatives, expected peri-op course discussed previously and reiterated today.   Alexis Frock, MD 10/15/2018, 7:17 AM

## 2018-10-15 NOTE — Transfer of Care (Signed)
  Last Vitals:  Vitals Value Taken Time  BP 116/69 10/15/2018  3:03 PM  Temp    Pulse 74 10/15/2018  3:09 PM  Resp 7 10/15/2018  3:09 PM  SpO2 98 % 10/15/2018  3:09 PM  Vitals shown include unvalidated device data.  Last Pain:  Vitals:   10/15/18 1052  TempSrc: Oral  PainSc: 0-No pain         Immediate Anesthesia Transfer of Care Note  Patient: Alan Walker  Procedure(s) Performed: Procedure(s) (LRB): NESBIT PROCEDURE (N/A)  Patient Location: PACU  Anesthesia Type: General  Level of Consciousness: awake, alert  and oriented  Airway & Oxygen Therapy: Patient Spontanous Breathing and Patient connected to nasal cannula oxygen  Post-op Assessment: Report given to PACU RN and Post -op Vital signs reviewed and stable  Post vital signs: Reviewed and stable  Complications: No apparent anesthesia complications

## 2018-10-15 NOTE — Anesthesia Procedure Notes (Signed)
Procedure Name: LMA Insertion Date/Time: 10/15/2018 2:02 PM Performed by: Nolon Nations, MD Pre-anesthesia Checklist: Patient identified, Emergency Drugs available, Suction available and Patient being monitored Patient Re-evaluated:Patient Re-evaluated prior to induction Oxygen Delivery Method: Circle system utilized Preoxygenation: Pre-oxygenation with 100% oxygen Induction Type: IV induction Ventilation: Mask ventilation without difficulty LMA: LMA inserted LMA Size: 5.0 Number of attempts: 1 Airway Equipment and Method: Bite block Placement Confirmation: positive ETCO2 Tube secured with: Tape Dental Injury: Teeth and Oropharynx as per pre-operative assessment

## 2018-10-15 NOTE — Discharge Instructions (Signed)
1 - You may have penile swelling and bruising for 2-3 weeks. This is normal. NO sexual stimulation until cleared by Dr. Tresa Moore at post-op visit. No restrictions on showering / bathing.   2 - Remove dressing tomorrow mid-morning. All skin stitches are dissolvable and will disappear over 2-3 weeks.   3 - Call MD or go to ER for fever >102, severe pain / nausea / vomiting not relieved by medications, or acute change in medical status   Post Anesthesia Home Care Instructions  Activity: Get plenty of rest for the remainder of the day. A responsible individual must stay with you for 24 hours following the procedure.  For the next 24 hours, DO NOT: -Drive a car -Paediatric nurse -Drink alcoholic beverages -Take any medication unless instructed by your physician -Make any legal decisions or sign important papers.  Meals: Start with liquid foods such as gelatin or soup. Progress to regular foods as tolerated. Avoid greasy, spicy, heavy foods. If nausea and/or vomiting occur, drink only clear liquids until the nausea and/or vomiting subsides. Call your physician if vomiting continues.  Special Instructions/Symptoms: Your throat may feel dry or sore from the anesthesia or the breathing tube placed in your throat during surgery. If this causes discomfort, gargle with warm salt water. The discomfort should disappear within 24 hours.

## 2018-10-15 NOTE — Op Note (Signed)
NAME: Alan Walker, Alan Walker MEDICAL RECORD EX:[id number] ACCOUNT [id number] DATE OF BIRTH:19-Mar-1961 FACILITY: [facility] LOCATION: [facility] PHYSICIAN:Niccole Witthuhn Tresa Moore, MD  OPERATIVE REPORT  DATE OF PROCEDURE:  10/15/2018  PREOPERATIVE DIAGNOSIS:  Peyronie's disease.  PROCEDURE: 1.  Penile plication. 2.  Penile block.  ESTIMATED BLOOD LOSS:  Nil.  COMPLICATIONS:  None.  SPECIMENS:  None.  FINDINGS: 1.  Approximately 35 degrees of ventral curvature, point of maximum curvature distal third of the penis. 2.  Correction of penile curvature following plication with 4 sutures.  INDICATIONS:  The patient is a very pleasant 58 year old gentleman who was found on workup of an unpleasant ventral penile curvature to have a significant Peyronie's disease with a palpable plaque deep to the urethra.  His disease has been stable for  over 6 months.  This is causing him significant distress and making coitus essentially impossible.  Options were discussed for management including observation versus injectables versus plication versus prosthetics.  His erections are actually fairly  good, and he wished to proceed with plication.  Informed consent was obtained and placed in the medical record.  PROCEDURE IN DETAIL:  The patient being identified, the procedure being penile plication was confirmed.  Procedure time-out was performed.  Intravenous antibiotics administered.  General anesthesia induced.  The patient was placed in supine position, and  a sterile field was created.  Prepped and draped the penis, perineum, scrotum and proximal thighs and infraumbilical abdomen using iodine.  A circumcising incision was made along the patient's prior circumcision site circumferentially at the level past  the penis.  The proximal penile skin was swept proximally, exposing the entire shaft of the penis, and the tourniquet of 1/2-inch Penroses was applied to the base of this.  An artificial erection was performed using  injectable saline through a right  lateral corporal injection site.  This erection with 80 mL injectable saline revealed a ventral curvature as expected with some slight leftward deviation as well, point of maximal curvature distal third of the penile shaft.  The point of maximal  curvature was demarcated.  Given the ventral curvature, it was felt that a dorsal plication in the exact midline would be warranted to help avoid any injury to the dorsal penile nerves.  A first plication butterfly type stitch of Ethibond was applied  across the area of maximum curvature, another stitch 5 mm distal and another 5 mm proximal.  Auto-suction was returned, and this revealed resolution of approximately 90% of the ventral curvature; however, he still had some very slight leftward curvature  as well.  As such, a single additional stitch was placed in position approximately 2 o'clock on the right corporal body, the point of maximal curvature, which then completely resolved the prior leftward deviation and the residual small amount of ventral  curvature.  Skin was reapproximated with a heel and toe interrupted 3-0 Vicryl and 2 separate running suture lines from the 6 o'clock to 12 o'clock position on each side with 3-0 Vicryl.  The tourniquet was released, and hemostasis was excellent.   Attention was directed to penile block.  Ten mL of 0.25% plain Marcaine was injected in a ring block type fashion at the base of the penis with additional 10 mL applied just inferior to the appropriate location along the projected course of the penile  nerve.  A dressing of loose Kling and Coban was applied, anesthesia terminated.  The patient tolerated the procedure well.  No immediate perioperative complications.  The patient was taken to postanesthesia care  in stable condition.  LN/NUANCE  D:10/15/2018 T:10/15/2018 JOB:[id number]

## 2018-10-15 NOTE — Brief Op Note (Signed)
10/15/2018  2:49 PM  PATIENT:  Alan Walker  58 y.o. male  PRE-OPERATIVE DIAGNOSIS:  PEYRONIES DISEASE  POST-OPERATIVE DIAGNOSIS:  PEYRONIES DISEASE  PROCEDURE:  Procedure(s): NESBIT PROCEDURE (N/A)  SURGEON:  Surgeon(s) and Role:    * Alexis Frock, MD - Primary  PHYSICIAN ASSISTANT:   ASSISTANTS: none   ANESTHESIA:   local and general  EBL:  minimal   BLOOD ADMINISTERED:none  DRAINS: none   LOCAL MEDICATIONS USED:  MARCAINE     SPECIMEN:  No Specimen  DISPOSITION OF SPECIMEN:  N/A  COUNTS:  YES  TOURNIQUET:  * No tourniquets in log *  DICTATION: .Other Dictation: Dictation Number [id number]  PLAN OF CARE: Discharge to home after PACU  PATIENT DISPOSITION:  PACU - hemodynamically stable.   Delay start of Pharmacological VTE agent (>24hrs) due to surgical blood loss or risk of bleeding: yes

## 2018-10-17 NOTE — Anesthesia Postprocedure Evaluation (Signed)
Anesthesia Post Note  Patient: Alan Walker  Procedure(s) Performed: NESBIT PROCEDURE (N/A Penis)     Patient location during evaluation: PACU Anesthesia Type: General Level of consciousness: sedated and patient cooperative Pain management: pain level controlled Vital Signs Assessment: post-procedure vital signs reviewed and stable Respiratory status: spontaneous breathing Cardiovascular status: stable Anesthetic complications: no    Last Vitals:  Vitals:   10/15/18 1545 10/15/18 1616  BP: 117/78 (!) 125/93  Pulse: 68 65  Resp: 14 16  Temp:  36.5 C  SpO2: 98% 99%    Last Pain:  Vitals:   10/15/18 1616  TempSrc:   PainSc: 0-No pain                 Nolon Nations

## 2018-10-18 ENCOUNTER — Encounter (HOSPITAL_BASED_OUTPATIENT_CLINIC_OR_DEPARTMENT_OTHER): Payer: Self-pay | Admitting: Urology

## 2018-12-11 ENCOUNTER — Other Ambulatory Visit: Payer: Self-pay | Admitting: Cardiovascular Disease

## 2018-12-13 ENCOUNTER — Other Ambulatory Visit: Payer: Self-pay | Admitting: Cardiovascular Disease

## 2019-02-01 ENCOUNTER — Other Ambulatory Visit: Payer: Self-pay | Admitting: Cardiovascular Disease

## 2019-03-07 ENCOUNTER — Other Ambulatory Visit: Payer: Self-pay | Admitting: Cardiovascular Disease

## 2019-04-01 ENCOUNTER — Emergency Department (HOSPITAL_COMMUNITY)
Admission: EM | Admit: 2019-04-01 | Discharge: 2019-04-02 | Disposition: A | Discharge status: Home / Self Care | Payer: BC Managed Care – PPO | Attending: Emergency Medicine | Admitting: Emergency Medicine

## 2019-04-01 ENCOUNTER — Emergency Department (HOSPITAL_COMMUNITY): Payer: BC Managed Care – PPO

## 2019-04-01 ENCOUNTER — Other Ambulatory Visit: Payer: Self-pay

## 2019-04-01 ENCOUNTER — Encounter (HOSPITAL_COMMUNITY): Payer: Self-pay

## 2019-04-01 DIAGNOSIS — H538 Other visual disturbances: Secondary | ICD-10-CM | POA: Insufficient documentation

## 2019-04-01 DIAGNOSIS — R42 Dizziness and giddiness: Secondary | ICD-10-CM | POA: Insufficient documentation

## 2019-04-01 DIAGNOSIS — R55 Syncope and collapse: Secondary | ICD-10-CM | POA: Diagnosis not present

## 2019-04-01 DIAGNOSIS — I2510 Atherosclerotic heart disease of native coronary artery without angina pectoris: Secondary | ICD-10-CM | POA: Insufficient documentation

## 2019-04-01 DIAGNOSIS — Z79899 Other long term (current) drug therapy: Secondary | ICD-10-CM | POA: Insufficient documentation

## 2019-04-01 DIAGNOSIS — I10 Essential (primary) hypertension: Secondary | ICD-10-CM | POA: Diagnosis not present

## 2019-04-01 NOTE — ED Triage Notes (Addendum)
Pt states while he was driving he noticed his vision went blurry and pulled over into the gas station and he "blacked out." Pt reports that he was able to tell what was going on around him but he could not see anything. Pt states he came back around and drove home with no difficulty. Pt denies any headaches, shortness of breath, dizziness or nausea prior to incident or after. Denies any history of the same.

## 2019-04-01 NOTE — ED Notes (Signed)
Pt transported to CT ?

## 2019-04-02 DIAGNOSIS — H538 Other visual disturbances: Secondary | ICD-10-CM | POA: Diagnosis not present

## 2019-04-02 DIAGNOSIS — R55 Syncope and collapse: Secondary | ICD-10-CM | POA: Diagnosis not present

## 2019-04-02 LAB — COMPREHENSIVE METABOLIC PANEL
ALT: 32 U/L (ref 0–44)
AST: 37 U/L (ref 15–41)
Albumin: 3.9 g/dL (ref 3.5–5.0)
Alkaline Phosphatase: 46 U/L (ref 38–126)
Anion gap: 10 (ref 5–15)
BUN: 22 mg/dL — ABNORMAL HIGH (ref 6–20)
CO2: 25 mmol/L (ref 22–32)
Calcium: 8.8 mg/dL — ABNORMAL LOW (ref 8.9–10.3)
Chloride: 104 mmol/L (ref 98–111)
Creatinine, Ser: 1.27 mg/dL — ABNORMAL HIGH (ref 0.61–1.24)
GFR calc Af Amer: >60 mL/min (ref >60)
GFR calc non Af Amer: >60 mL/min (ref >60)
Glucose, Bld: 101 mg/dL — ABNORMAL HIGH (ref 70–99)
Potassium: 4.5 mmol/L (ref 3.5–5.1)
Sodium: 139 mmol/L (ref 135–145)
Total Bilirubin: 0.9 mg/dL (ref 0.3–1.2)
Total Protein: 8.8 g/dL — ABNORMAL HIGH (ref 6.5–8.1)

## 2019-04-02 LAB — CBC
HCT: 40.2 % (ref 39.0–52.0)
Hemoglobin: 13.2 g/dL (ref 13.0–17.0)
MCH: 31.5 pg (ref 26.0–34.0)
MCHC: 32.8 g/dL (ref 30.0–36.0)
MCV: 95.9 fL (ref 80.0–100.0)
Platelets: 225 10*3/uL (ref 150–400)
RBC: 4.19 MIL/uL — ABNORMAL LOW (ref 4.22–5.81)
RDW: 11.3 % — ABNORMAL LOW (ref 11.5–15.5)
WBC: 8.5 10*3/uL (ref 4.0–10.5)
nRBC: 0 % (ref 0.0–0.2)

## 2019-04-02 LAB — RAPID URINE DRUG SCREEN, HOSP PERFORMED
Amphetamines: NOT DETECTED
Barbiturates: NOT DETECTED
Benzodiazepines: NOT DETECTED
Cocaine: NOT DETECTED
Opiates: NOT DETECTED
Tetrahydrocannabinol: NOT DETECTED

## 2019-04-02 MED ORDER — SODIUM CHLORIDE 0.9 % IV BOLUS
500.0000 mL | Freq: Once | INTRAVENOUS | Status: AC
  Administered 2019-04-02: 500 mL via INTRAVENOUS

## 2019-04-02 MED ORDER — SODIUM CHLORIDE 0.9 % IV BOLUS
1000.0000 mL | Freq: Once | INTRAVENOUS | Status: AC
  Administered 2019-04-02: 1000 mL via INTRAVENOUS

## 2019-04-02 NOTE — ED Notes (Signed)
Pt stated he was too dizzy to stay stand for the "standing at 3 minutes" for his orthostatic vital signs. Will notify MD of findings.

## 2019-04-02 NOTE — ED Notes (Signed)
Pt. Ambulated down the hall and back to his room without difficulty. Pt. Gait steady on his feet. While ambulating pt. Had no dizziness.

## 2019-04-02 NOTE — ED Provider Notes (Signed)
[facility] DEPT Provider Note   CSN: [id number] Arrival date & time: 04/01/19  1955    History   Chief Complaint Chief Complaint  Patient presents with  . Loss of Vision    HPI Alan Walker is a 58 y.o. male.     HPI  58 year old male with history of CAD, hypertension comes in a chief complaint of dizziness.  Patient reports that he was driving home from his brothers shop, when suddenly he started blacking out.  He pulled over and the symptoms lasted for about 5 minutes.  He had bilateral vision loss described as seeing shadows.  He had associated dizziness, but he is unsure if he was going to pass out or not.  There was no associated chest pain, shortness of breath, palpitation, nausea, focal numbness, weakness, slurred speech.  While in the waiting room he had another episode of dizziness while sitting down.  He laid down and got better once he did that.  He denies any vomiting, diarrhea, reduced p.o. intake. No new meds.  Past Medical History:  Diagnosis Date  . CAD (coronary artery disease) cardiologist-  dr Burt Knack   10-20-2014 cardiac cath--  occluded distal LAD typical appearance of spontaneous dissection with intramural hematoma present, ef 50-55% with inferior-apical AK  . Dyslipidemia (high LDL; low HDL) 10/20/2014  . History of exercise stress test    ETT 10-11-2015 normal  . History of ST elevation myocardial infarction (STEMI) 10/20/2014,  EMS shocked pt x1 for VFib   inferior wall per cardic cath --  occlusion distal LAD with typical angiographic appearance of spontaneous coronary arter dissection,  medical therapy recommended  . Hypertension   . Palpitations    event monitor 12-03-2017 rare PVCs, PACs with short SVT runs,  per dr cooper result note, benign  . Peyronie's disease   . Wears glasses     Patient Active Problem List   Diagnosis Date Noted  . Coronary artery dissection 10/30/2014  . Hypokalemia 10/23/2014  .  Dyslipidemia (high LDL; low HDL)   . Hypertension   . ST elevation (STEMI) myocardial infarction involving left anterior descending coronary artery ([place]) 10/20/2014    Past Surgical History:  Procedure Laterality Date  . LEFT HEART CATHETERIZATION WITH CORONARY ANGIOGRAM N/A 10/20/2014   Procedure: LEFT HEART CATHETERIZATION WITH CORONARY ANGIOGRAM;  Surgeon: Blane Ohara, MD; L main OK, LAD 100% distal (dissection), CFX 0%, RCA 0%, EF 50-55%   . NESBIT PROCEDURE N/A 10/15/2018   Procedure: NESBIT PROCEDURE;  Surgeon: Alexis Frock, MD;  Location: [facility];  Service: Urology;  Laterality: N/A;  . NO PAST SURGERIES          Home Medications    Prior to Admission medications   Medication Sig Start Date End Date Taking? Authorizing Provider  amLODipine (NORVASC) 5 MG tablet TAKE 1 TABLET BY MOUTH DAILY Patient taking differently: Take 5 mg by mouth daily.  03/09/19  Yes Sherren Mocha, MD  atorvastatin (LIPITOR) 20 MG tablet TAKE 1 TABLET BY MOUTH DAILY AT 6 PM Patient taking differently: Take 20 mg by mouth daily.  02/01/19  Yes Sherren Mocha, MD  carvedilol (COREG) 12.5 MG tablet TAKE 1 TABLET BY MOUTH TWICE DAILY WITH A MEAL Patient taking differently: Take 12.5 mg by mouth 2 (two) times daily with a meal.  12/13/18  Yes Sherren Mocha, MD  nitroGLYCERIN (NITROSTAT) 0.4 MG SL tablet Place 1 tablet (0.4 mg total) under the tongue every 5 (five) minutes  as needed for chest pain. 10/23/14  Yes Barrett, Evelene Croon, PA-C  traMADol (ULTRAM) 50 MG tablet Take 1-2 tablets (50-100 mg total) by mouth every 6 (six) hours as needed for moderate pain or severe pain. Post-operatively Patient not taking: Reported on 04/01/2019 10/15/18   Alexis Frock, MD    Family History Family History  Problem Relation Age of Onset  . Hypertension Mother   . Asthma Mother   . Asthma Son   . Stroke Brother   . Heart attack Neg Hx     Social History Social History   Tobacco Use  .  Smoking status: Never Smoker  . Smokeless tobacco: Never Used  Substance Use Topics  . Alcohol use: No  . Drug use: Never     Allergies   Patient has no known allergies.   Review of Systems Review of Systems  Constitutional: Positive for activity change.  Respiratory: Negative for shortness of breath.   Cardiovascular: Negative for chest pain.  Gastrointestinal: Negative for nausea and vomiting.  Neurological: Positive for dizziness.  Hematological: Does not bruise/bleed easily.  All other systems reviewed and are negative.    Physical Exam Updated Vital Signs BP (!) 94/54   Pulse 86   Temp 98.5 F (36.9 C) (Oral)   Resp (!) 21   SpO2 97%   Physical Exam Vitals signs and nursing note reviewed.  Constitutional:      Appearance: He is well-developed.  HENT:     Head: Atraumatic.  Eyes:     Extraocular Movements: Extraocular movements intact.     Pupils: Pupils are equal, round, and reactive to light.  Neck:     Musculoskeletal: Neck supple.     Comments: No meningismus Cardiovascular:     Rate and Rhythm: Normal rate.  Pulmonary:     Effort: Pulmonary effort is normal.  Skin:    General: Skin is warm.  Neurological:     Mental Status: He is alert and oriented to person, place, and time.     Cranial Nerves: No cranial nerve deficit.     Sensory: No sensory deficit.     Motor: No weakness.     Coordination: Coordination normal.      ED Treatments / Results  Labs (all labs ordered are listed, but only abnormal results are displayed) Labs Reviewed  CBC - Abnormal; Notable for the following components:      Result Value   RBC 4.19 (*)    RDW 11.3 (*)    All other components within normal limits  COMPREHENSIVE METABOLIC PANEL - Abnormal; Notable for the following components:   Glucose, Bld 101 (*)    BUN 22 (*)    Creatinine, Ser 1.27 (*)    Calcium 8.8 (*)    Total Protein 8.8 (*)    All other components within normal limits  RAPID URINE DRUG  SCREEN, HOSP PERFORMED    EKG None  Radiology Ct Head Wo Contrast  Result Date: 04/02/2019 CLINICAL DATA:  Blurred vision, syncope EXAM: CT HEAD WITHOUT CONTRAST TECHNIQUE: Contiguous axial images were obtained from the base of the skull through the vertex without intravenous contrast. COMPARISON:  10/23/2014 FINDINGS: Brain: No evidence of acute infarction, hemorrhage, hydrocephalus, extra-axial collection or mass lesion/mass effect. Left basal ganglia calcifications, unchanged. Mild subcortical white matter and periventricular small vessel ischemic changes. Vascular: No hyperdense vessel or unexpected calcification. Skull: Normal. Negative for fracture or focal lesion. Sinuses/Orbits: The visualized paranasal sinuses are essentially clear. The mastoid air cells  are unopacified. Other: None. IMPRESSION: No evidence of acute intracranial abnormality. Mild small vessel ischemic changes. Electronically Signed   By: Julian Hy M.D.   On: 04/02/2019 00:27    Procedures Procedures (including critical care time)  Medications Ordered in ED Medications  sodium chloride 0.9 % bolus 1,000 mL (0 mLs Intravenous Stopped 04/02/19 0315)  sodium chloride 0.9 % bolus 500 mL (500 mLs Intravenous New Bag/Given 04/02/19 0340)     Initial Impression / Assessment and Plan / ED Course  I have reviewed the triage vital signs and the nursing notes.  Pertinent labs & imaging results that were available during my care of the patient were reviewed by me and considered in my medical decision making (see chart for details).  Clinical Course as of Apr 02 451  Sat Apr 02, 2019  0452 Patient has received 1.5 L of IV fluid.  He was just ambulated and he did not have any dizziness.  He states that he feels a lot better.  Neuro consultation and recommendation reviewed.  I think it is safe for patient to be discharged right now, with strict ER return precautions.   [AN]  [id number] The patient appears reasonably screened  and/or stabilized for discharge and I doubt any other medical condition or other [facility] requiring further screening, evaluation, or treatment in the ED at this time prior to discharge.  Results from the ER workup discussed with the patient face to face and all questions answered to the best of my ability. The patient is safe for discharge with strict return precautions.     [AN]    Clinical Course User Index [AN] Varney Biles, MD       58 year old comes with a chief complaint of dizziness. Patient has history of CAD, no history of strokes.  He was driving home when he had bilateral loss of vision, described as "cloudy" vision.  He also had associated dizziness.  He had a repeat episode while sitting down in the waiting room.  He denies any vomiting, diarrhea, new medications.  Differential diagnosis includes orthostasis, and he was positive for orthostatics here.  However, I cannot explain why he had dizziness x2 while sitting down, without any position change.  Telemetry neurology has been consulted.  Given that there was bilateral vision impacted, chances of stroke is low.  4:52 AM Tele-neurology team has cleared the patient from neuro perspective.  They think he can go home, and suspect that this was likely orthostatic hypotension. We will give another fluid bolus and ambulate as his BP is still soft. On reassessment patient denies any infection like symptoms.  Final Clinical Impressions(s) / ED Diagnoses   Final diagnoses:  Orthostatic dizziness    ED Discharge Orders    None       Varney Biles, MD 04/02/19 [phone]

## 2019-04-02 NOTE — Discharge Instructions (Addendum)
You are seen in the ER for dizziness.  The work-up in the ER does not show any cardiac abnormalities and the neurologist assessment was also normal. All the results in the ER are overall reassuring.  Your blood pressure has been dropping every time you get up, and the suspicion right now is that you are having orthostatic hypotension.  Please hydrate yourself well. See your primary care doctor in 5 days. Return to the ER immediately if you start having sudden and severe dizziness, fainting, slurred speech, vision change.

## 2019-04-02 NOTE — Consult Note (Signed)
[facility] Tele[facility]urology Consult Services  Stat Consult  Date of Service:   04/01/2019 23:43:58  Impression:     .  Orthostatic hypotension     .  Near syncope with bilateral visual loss  Comments/Sign-Out: 58 yo M with acute bilateral vision loss followed by near syncope out for 5 minutes. Was normal on arrival now reporting lightheadedness when standing. No lateralizing features concerning for TIA. No features to suggest migraine or seizure. Orthostatics were abnormal. Consider adjusting BP regimen   OK for d/c home if he is no longer orthostatic and ambulatory, can follow up with his PCP and have outpatient testing for potential causes   If still symptomatic would have him admitted for orthostatic hypotension and cardiac workup.   d/w ED provider  CT HEAD: Showed No Acute Hemorrhage or Acute Core Infarct BUN 22 Cr 1.27 Glucose 101  Metrics: [facility] Notification Time: 04/01/2019 23:43:58 Stamp Time: 04/01/2019 23:43:58 Callback Response Time: 04/01/2019 23:53:31 Video Start Time: 04/02/2019 03:16:47 Video End Time: 04/02/2019 03:26:51  Disposition: Sign Off  Sign Out:     .  Discussed with Emergency Department Provider  ----------------------------------------------------------------------------------------------------  Chief Complaint: Transient vision loss, almost passed out driving  History of Present Illness: Patient is a 58 year old Male.  59 yo M hx HTN, prior MI, with sudden onset blurred vision then bilateral vision loss. while driving home around 8p. He felt lightheaded and diaphoretic. He was able to pull over then almost passed out. He could only see shadows. 5 minutes later returned back to normal. He had no stroke like symptoms, no headache and full recollection of the event.    CT head neg for bleed or early ischemic change.   Still reports lightheadedness when trying to stand and was unable to stand up  for orthostatic vitals.  He reports compliance with his home BP meds, and usually runs 696-295M systolic.    Laying 155/80, 77  Sitting 150/79, 79  Standing 163/92, 91 --> 149/96, 93  Examination: BP(112/67), 1A: Level of Consciousness - Alert; keenly responsive + 0 1B: Ask Month and Age - Both Questions Right + 0 1C: Blink Eyes & Squeeze Hands - Performs Both Tasks + 0 2: Test Horizontal Extraocular Movements - Normal + 0 3: Test Visual Fields - No Visual Loss + 0 4: Test Facial Palsy (Use Grimace if Obtunded) - Normal symmetry + 0 5A: Test Left Arm Motor Drift - No Drift for 10 Seconds + 0 5B: Test Right Arm Motor Drift - No Drift for 10 Seconds + 0 6A: Test Left Leg Motor Drift - No Drift for 5 Seconds + 0 6B: Test Right Leg Motor Drift - No Drift for 5 Seconds + 0 7: Test Limb Ataxia (FNF/Heel-Shin) - No Ataxia + 0 8: Test Sensation - Normal; No sensory loss + 0 9: Test Language/Aphasia - Normal; No aphasia + 0 10: Test Dysarthria - Normal + 0 11: Test Extinction/Inattention - No abnormality + 0  NIHSS Score: 0  Patient/Family was informed the Neurology Consult would happen via TeleHealth consult by way of interactive audio and video telecommunications and consented to receiving care in this manner.  Due to the immediate potential for life-threatening deterioration due to underlying acute neurologic illness, I spent 35 minutes providing critical care. This time includes time for face to face visit via telemedicine, review of medical records, imaging studies and discussion of findings with providers, the patient and/or family.   Dr Leda Quail   [facility] [phone])  [phone]   Case [id number]

## 2019-04-02 NOTE — ED Notes (Signed)
Pt ambulated to bathroom with steady gait. Staff walked beside pt for safety.

## 2019-04-07 DIAGNOSIS — I10 Essential (primary) hypertension: Secondary | ICD-10-CM | POA: Diagnosis not present

## 2019-04-07 DIAGNOSIS — Z01021 Encounter for examination of eyes and vision following failed vision screening with abnormal findings: Secondary | ICD-10-CM | POA: Diagnosis not present

## 2019-04-07 DIAGNOSIS — Z0001 Encounter for general adult medical examination with abnormal findings: Secondary | ICD-10-CM | POA: Diagnosis not present

## 2019-04-07 DIAGNOSIS — Z01118 Encounter for examination of ears and hearing with other abnormal findings: Secondary | ICD-10-CM | POA: Diagnosis not present

## 2019-04-07 DIAGNOSIS — Z1389 Encounter for screening for other disorder: Secondary | ICD-10-CM | POA: Diagnosis not present

## 2019-04-07 DIAGNOSIS — E669 Obesity, unspecified: Secondary | ICD-10-CM | POA: Diagnosis not present

## 2019-04-07 DIAGNOSIS — Z131 Encounter for screening for diabetes mellitus: Secondary | ICD-10-CM | POA: Diagnosis not present

## 2019-04-07 DIAGNOSIS — Z136 Encounter for screening for cardiovascular disorders: Secondary | ICD-10-CM | POA: Diagnosis not present

## 2019-04-07 DIAGNOSIS — I252 Old myocardial infarction: Secondary | ICD-10-CM | POA: Diagnosis not present

## 2019-04-07 DIAGNOSIS — E7800 Pure hypercholesterolemia, unspecified: Secondary | ICD-10-CM | POA: Diagnosis not present

## 2019-04-14 ENCOUNTER — Other Ambulatory Visit: Payer: Self-pay | Admitting: Cardiovascular Disease

## 2019-04-28 ENCOUNTER — Other Ambulatory Visit: Payer: Self-pay | Admitting: Cardiovascular Disease

## 2019-06-09 ENCOUNTER — Other Ambulatory Visit: Payer: Self-pay | Admitting: Cardiovascular Disease

## 2019-06-09 MED ORDER — ATORVASTATIN CALCIUM 20 MG PO TABS: ORAL_TABLET | ORAL | 0 refills | Status: DC

## 2019-06-27 ENCOUNTER — Encounter: Payer: Self-pay | Admitting: Cardiovascular Disease

## 2019-06-27 ENCOUNTER — Ambulatory Visit (INDEPENDENT_AMBULATORY_CARE_PROVIDER_SITE_OTHER): Payer: BC Managed Care – PPO | Admitting: Cardiovascular Disease

## 2019-06-27 ENCOUNTER — Other Ambulatory Visit: Payer: Self-pay

## 2019-06-27 ENCOUNTER — Encounter (INDEPENDENT_AMBULATORY_CARE_PROVIDER_SITE_OTHER): Payer: Self-pay

## 2019-06-27 VITALS — BP 114/82 | HR 62 | Ht 71.0 in | Wt 207.8 lb

## 2019-06-27 DIAGNOSIS — R002 Palpitations: Secondary | ICD-10-CM | POA: Diagnosis not present

## 2019-06-27 DIAGNOSIS — I10 Essential (primary) hypertension: Secondary | ICD-10-CM | POA: Diagnosis not present

## 2019-06-27 DIAGNOSIS — E782 Mixed hyperlipidemia: Secondary | ICD-10-CM | POA: Diagnosis not present

## 2019-06-27 DIAGNOSIS — I2510 Atherosclerotic heart disease of native coronary artery without angina pectoris: Secondary | ICD-10-CM

## 2019-06-27 MED ORDER — AMLODIPINE BESYLATE 5 MG PO TABS: 5.0000 mg | ORAL_TABLET | Freq: Every day | ORAL | 3 refills | Status: DC

## 2019-06-27 NOTE — Patient Instructions (Signed)
Medication Instructions:  Your provider recommends that you continue on your current medications as directed. Please refer to the Current Medication list given to you today.    Labwork: Your provider recommends that you return for lab work prior to your next appointment in 1 year.  Testing/Procedures: None  Follow-Up: Your provider wants you to follow-up in: 1 year with Dr. Burt Knack. You will receive a reminder letter in the mail two months in advance. If you don't receive a letter, please call our office to schedule the follow-up appointment.    Any Other Special Instructions Will Be Listed Below (If Applicable).     If you need a refill on your cardiac medications before your next appointment, please call your pharmacy.

## 2019-06-27 NOTE — Progress Notes (Signed)
Cardiology Office Note:    Date:  06/28/2019   ID:  Alan Walker, DOB [birth year], MRN [id number]  PCP:  Patient, No Pcp Per  Cardiologist:  Sherren Mocha, MD  Electrophysiologist:  None   Referring MD: No ref. provider found   Chief Complaint  Patient presents with  . Coronary Artery Disease    History of Present Illness:    Alan Walker is a 58 y.o. male with a hx of coronary artery disease, presenting for follow-up evaluation.  The patient initially presented in 2016 with an inferior wall MI.  Emergency catheterization demonstrated occlusion of the distal LAD with the typical angiographic appearance of spontaneous coronary artery dissection.  Medical therapy was recommended.  The patient has had no recurrent ischemic events.  At the time of his last visit he complained of heart palpitations and underwent a Holter monitor which demonstrated no significant arrhythmia.  His basic rhythm was sinus and he had occasional PACs and PVCs with no sustained runs.  The patient is here alone today.  He feels great with no symptoms at this time.  He is exercising regularly and has brought his weight down from 233 pounds to 208 pounds over the past year.  He has been watching his diet more carefully and he is doing a variety of exercises.  He specifically denies chest pain, shortness of breath, heart palpitations, orthopnea, or PND.  Past Medical History:  Diagnosis Date  . CAD (coronary artery disease) cardiologist-  dr Burt Knack   10-20-2014 cardiac cath--  occluded distal LAD typical appearance of spontaneous dissection with intramural hematoma present, ef 50-55% with inferior-apical AK  . Dyslipidemia (high LDL; low HDL) 10/20/2014  . History of exercise stress test    ETT 10-11-2015 normal  . History of ST elevation myocardial infarction (STEMI) 10/20/2014,  EMS shocked pt x1 for VFib   inferior wall per cardic cath --  occlusion distal LAD with typical angiographic appearance of  spontaneous coronary arter dissection,  medical therapy recommended  . Hypertension   . Palpitations    event monitor 12-03-2017 rare PVCs, PACs with short SVT runs,  per dr Javen Hinderliter result note, benign  . Peyronie's disease   . Wears glasses     Past Surgical History:  Procedure Laterality Date  . LEFT HEART CATHETERIZATION WITH CORONARY ANGIOGRAM N/A 10/20/2014   Procedure: LEFT HEART CATHETERIZATION WITH CORONARY ANGIOGRAM;  Surgeon: Blane Ohara, MD; L main OK, LAD 100% distal (dissection), CFX 0%, RCA 0%, EF 50-55%   . NESBIT PROCEDURE N/A 10/15/2018   Procedure: NESBIT PROCEDURE;  Surgeon: Alexis Frock, MD;  Location: [facility];  Service: Urology;  Laterality: N/A;  . NO PAST SURGERIES      Current Medications: Current Meds  Medication Sig  . amLODipine (NORVASC) 5 MG tablet Take 1 tablet (5 mg total) by mouth daily.  Marland Kitchen aspirin EC 81 MG tablet Take 81 mg by mouth daily.  Marland Kitchen atorvastatin (LIPITOR) 20 MG tablet TAKE 1 TABLET BY MOUTH DAILY AT 6 PM. Please keep upcoming appt with Dr. Burt Knack in October before anymore refills. Thank you  . carvedilol (COREG) 12.5 MG tablet Take 1 tablet (12.5 mg total) by mouth 2 (two) times daily with a meal. Please schedule appt for future refills. 1st attempt  . nitroGLYCERIN (NITROSTAT) 0.4 MG SL tablet Place 1 tablet (0.4 mg total) under the tongue every 5 (five) minutes as needed for chest pain.  . [DISCONTINUED] amLODipine (NORVASC) 5 MG tablet Take  1 tablet (5 mg total) by mouth daily. Please keep 06/27/19 appointment for further refills     Allergies:   Patient has no known allergies.   Social History   Socioeconomic History  . Marital status: Married    Spouse name: Not on file  . Number of children: Not on file  . Years of education: Not on file  . Highest education level: Not on file  Occupational History  . Not on file  Social Needs  . Financial resource strain: Not on file  . Food insecurity    Worry: Not on  file    Inability: Not on file  . Transportation needs    Medical: Not on file    Non-medical: Not on file  Tobacco Use  . Smoking status: Never Smoker  . Smokeless tobacco: Never Used  Substance and Sexual Activity  . Alcohol use: No  . Drug use: Never  . Sexual activity: Yes  Lifestyle  . Physical activity    Days per week: Not on file    Minutes per session: Not on file  . Stress: Not on file  Relationships  . Social Herbalist on phone: Not on file    Gets together: Not on file    Attends religious service: Not on file    Active member of club or organization: Not on file    Attends meetings of clubs or organizations: Not on file    Relationship status: Not on file  Other Topics Concern  . Not on file  Social History Narrative  . Not on file     Family History: The patient's family history includes Asthma in his mother and son; Hypertension in his mother; Stroke in his brother. There is no history of Heart attack.  ROS:   Please see the history of present illness.    All other systems reviewed and are negative.  EKGs/Labs/Other Studies Reviewed:    EKG:  EKG is ordered today.  The ekg ordered today demonstrates normal sinus rhythm with age-indeterminate inferior infarct, unchanged from previous tracing.  Recent Labs: 04/01/2019: ALT 32; BUN 22; Creatinine, Ser 1.27; Hemoglobin 13.2; Platelets 225; Potassium 4.5; Sodium 139  Recent Lipid Panel    Component Value Date/Time   CHOL 134 12/03/2017 0806   TRIG 59 12/03/2017 0806   HDL 44 12/03/2017 0806   CHOLHDL 3.0 12/03/2017 0806   CHOLHDL 3 11/24/2014 0835   VLDL 16.6 11/24/2014 0835   LDLCALC 78 12/03/2017 0806    Physical Exam:    VS:  BP 114/82   Pulse 62   Ht 5\' 11"  (1.803 m)   Wt 207 lb 12.8 oz (94.3 kg)   SpO2 98%   BMI 28.98 kg/m     Wt Readings from Last 3 Encounters:  06/27/19 207 lb 12.8 oz (94.3 kg)  10/15/18 226 lb 3.2 oz (102.6 kg)  11/20/17 233 lb 9.6 oz (106 kg)      GEN: Well nourished, well developed in no acute distress HEENT: Normal NECK: No JVD; No carotid bruits LYMPHATICS: No lymphadenopathy CARDIAC: RRR, no murmurs, rubs, gallops RESPIRATORY:  Clear to auscultation without rales, wheezing or rhonchi  ABDOMEN: Soft, non-tender, non-distended MUSCULOSKELETAL:  No edema; No deformity  SKIN: Warm and dry NEUROLOGIC:  Alert and oriented x 3 PSYCHIATRIC:  Normal affect   ASSESSMENT:    1. Coronary artery disease involving native coronary artery of native heart without angina pectoris   2. Essential hypertension   3.  Mixed hyperlipidemia   4. Palpitations    PLAN:    In order of problems listed above:  1. The patient is doing very well on his current medical program.  Medications are reviewed and include aspirin, beta-blocker, and high intensity statin drug. 2. Blood pressure is very well controlled.  He reports some readings in the low 100s.  However he has no symptoms.  We will continue his current medicines.  I asked him to contact me if his systolic blood pressure starts to drop below 100 mmHg.  Even in an absence of symptoms I would probably reduce his antihypertensive  therapy at that time. 3. Continue atorvastatin at current dose.  Most recent lipids reviewed as above. 4. Symptoms have resolved.  Continue carvedilol.   Medication Adjustments/Labs and Tests Ordered: Current medicines are reviewed at length with the patient today.  Concerns regarding medicines are outlined above.  Orders Placed This Encounter  Procedures  . Lipid panel  . CBC with Differential/Platelet  . Comprehensive metabolic panel  . EKG 12-Lead   Meds ordered this encounter  Medications  . amLODipine (NORVASC) 5 MG tablet    Sig: Take 1 tablet (5 mg total) by mouth daily.    Dispense:  90 tablet    Refill:  3    Patient Instructions  Medication Instructions:  Your provider recommends that you continue on your current medications as directed. Please  refer to the Current Medication list given to you today.    Labwork: Your provider recommends that you return for lab work prior to your next appointment in 1 year.  Testing/Procedures: None  Follow-Up: Your provider wants you to follow-up in: 1 year with Dr. Burt Knack. You will receive a reminder letter in the mail two months in advance. If you don't receive a letter, please call our office to schedule the follow-up appointment.    Any Other Special Instructions Will Be Listed Below (If Applicable).     If you need a refill on your cardiac medications before your next appointment, please call your pharmacy.      Signed, Sherren Mocha, MD  06/28/2019 9:10 AM    [place]

## 2019-07-26 ENCOUNTER — Other Ambulatory Visit: Payer: Self-pay | Admitting: Cardiovascular Disease

## 2019-07-29 ENCOUNTER — Telehealth: Payer: Self-pay

## 2019-07-29 MED ORDER — ATORVASTATIN CALCIUM 20 MG PO TABS: 20.0000 mg | ORAL_TABLET | Freq: Every day | ORAL | 3 refills | Status: DC

## 2019-07-29 NOTE — Telephone Encounter (Signed)
RX for Atorvastatin sent to pharmacy.

## 2019-11-09 ENCOUNTER — Encounter (HOSPITAL_COMMUNITY): Payer: Self-pay

## 2019-11-09 ENCOUNTER — Other Ambulatory Visit: Payer: Self-pay

## 2019-11-09 ENCOUNTER — Ambulatory Visit (HOSPITAL_COMMUNITY): Payer: Medicaid Other | Admitting: Emergency Medicine

## 2019-11-09 ENCOUNTER — Ambulatory Visit
Admission: EM | Admit: 2019-11-09 | Discharge: 2019-11-09 | Disposition: A | Discharge status: Home / Self Care | Payer: Medicaid Other | Attending: Physician Assistant | Admitting: Physician Assistant

## 2019-11-09 DIAGNOSIS — L720 Epidermal cyst: Secondary | ICD-10-CM | POA: Insufficient documentation

## 2019-11-09 LAB — POC BLOOD GLUCOSE (RESULTS): GLUCOSE, POC: 93 mg/dl (ref 60–110)

## 2019-11-09 MED ORDER — LIDOCAINE HCL 10 MG/ML (1 %) INJECTION SOLUTION
2.00 mL | INTRAMUSCULAR | Status: DC
Start: 2019-11-09 — End: 2019-11-09
  Filled 2019-11-09: qty 20

## 2019-11-09 MED ORDER — CEPHALEXIN 500 MG CAPSULE
500.00 mg | ORAL_CAPSULE | Freq: Four times a day (QID) | ORAL | 0 refills | Status: AC
Start: 2019-11-09 — End: 2019-11-19

## 2019-11-09 NOTE — Procedures (Signed)
Encounter Date: 11/09/2019     Emergency Department Procedure:    I&D Abscess  Procedure performed at: 1926  Pre-Procedure Documentation: Skin was prepped and draped, Sterile technique was used  Skin was prepped with: Betadine  Local Anesthesia: Lidocaine Plain, 1%  Description:: Single simple abscess  Fluid was grossly: Purulent, Bloody  Incision with: 11 blade  Documentation: Evaluated for loculations, Cultured  Ultrasound Guidance Required?: No  Attending Physician: Other (Horner)

## 2019-11-09 NOTE — ED Triage Notes (Signed)
Patient c/o painful cyst to back x 3 days around left scapula.

## 2019-11-09 NOTE — ED Nurses Note (Signed)
Patient discharged with written and verbal education pertaining to disease process. Verbally acknowledged understanding.   No questions or concerns at time of d/c. Ambulated to exit.

## 2019-11-09 NOTE — ED Provider Notes (Signed)
[place]  DEPARTMENT OF EMERGENCY MEDICINE  EMERGENCY DEPARTMENT HISTORY AND PHYSICAL      Chief Complaint:    Patient is a 59 y.o.  male presenting to the [facility] with chief complaint of cyst on back.     History of Present Illness:    Patient presents to the prompt Care today complaining of a painful cyst on his back.  Patient states that he has had this cyst for a while.  He has noticed increased swelling and pain to that area the past 3 days.  He denies any fevers.      Review of Systems:    Constitutional: No fever, chills  Skin: No rashes or lesions  MSK: No joint pain.  No neck or back pain  Neuro: No numbness, tingling, or weakness.  Psych: No SI or HI. Normal mood  All other symptoms reviewed and are negative, unless commented on in the HPI.     Past Medical History:  History reviewed. No pertinent past medical history.  History reviewed. No pertinent surgical history.    Above history reviewed with patient.  Allergies, medication list, and old records also reviewed.     Social History:    Social History     Tobacco Use   . Smoking status: Current Every Day Smoker   . Smokeless tobacco: Current User     Types: Chew   Substance Use Topics   . Alcohol use: Yes     Alcohol/week: 4.0 standard drinks     Types: 4 Cans of beer per week   . Drug use: Never       Vitals:    Filed Vitals:    11/09/19 1854   BP: (!) 142/90   Pulse: 95   Resp: 18   Temp: 36.1 C (96.9 F)   SpO2: 99%       Physical Exam:     Nursing note and vitals reviewed.  Vital signs reviewed as above. No acute distress.   Constitutional: Pt is well-developed and well-nourished.   Musculoskeletal: Normal range of motion. No deformities.    Neurological: CNs 2-12 grossly intact.  No focal deficits noted.  Skin:  Cyst to left mid back.  Psychiatric: Patient has a normal mood and affect.       Labs:  No results found for any visits on 11/09/19.    Imaging:         Orders Placed This Encounter   . WOUND, SUPERFICIAL/NON-STERILE SITE, AEROBIC  CULTURE AND GRAM STAIN   . PERFORM POC WHOLE BLOOD GLUCOSE   . lidocaine 1% injection   . cephalexin (KEFLEX) 500 mg Oral Capsule       Encounter Date: 11/09/2019     Emergency Department Procedure:    I&D Abscess  Procedure performed at: 1926  Pre-Procedure Documentation: Skin was prepped and draped, Sterile technique was used  Skin was prepped with: Betadine  Local Anesthesia: Lidocaine Plain, 1%  Description:: Single simple abscess  Fluid was grossly: Purulent, Bloody  Incision with: 11 blade  Documentation: Evaluated for loculations, Cultured  Ultrasound Guidance Required?: No  Attending Physician: Other (Horner)    MDM:     Fingerstick blood glucose 93  Illness process discussed with patient.    Advised to follow up with PCP 2-3 days.  Return here for worsening symptoms or other concerns.  Patient was given the opportunity to ask any questions prior to discharge.  Patient was involved in medical decision making and is agreeable to treatment/discharge  plan.  Patient is stable for discharge.     Impression:     Inclusion cyst    Disposition/Plan:    DC home with RX for keflex      This note was partially generated using MModal Fluency Direct system, and there may be some incorrect words, spellings, and punctuation that were not noted in checking the note before saving.      [employer], PA-C

## 2019-11-12 LAB — WOUND, SUPERFICIAL/NON-STERILE SITE, AEROBIC CULTURE AND GRAM STAIN

## 2020-06-23 NOTE — Progress Notes (Signed)
Cardiology Office Note   Date:  06/27/2020   ID:  Alan Walker, DOB 02-Aug-1961, MRN [id number]  PCP:  Patient, No Pcp Per  Cardiologist:  Dr. Burt Knack, MD    Chief Complaint  Patient presents with  . Follow-up    History of Present Illness: Alan Walker is a 59 y.o. male who presents for annual follow up, seen for Dr. Burt Knack.   Alan Walker has a history of CAD who initially presented in 2016 with an inferior wall MI.  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  Medical therapy was recommended at that time.  He has had no recurrent ischemic events.  He had previous complaints of heart palpitations and wore a Holter monitor which showed no evidence of arrhythmias, only occasional PACs and PVCs with no sustained runs.  He was last seen by Dr. Burt Knack 06/27/2019 with no CV symptoms.  He was exercising regularly and brought his weight down from 233 to 208lb.   Today he is doing very well from a CV standpoint. He has no anginal complaints. He continues to exercise 6 days per week with running and weight training. Denies SOB, orthopnea, dizziness, LE edema, palpitations, or syncope.    Past Medical History:  Diagnosis Date  . CAD (coronary artery disease) cardiologist-  dr Burt Knack   10-20-2014 cardiac cath--  occluded distal LAD typical appearance of spontaneous dissection with intramural hematoma present, ef 50-55% with inferior-apical AK  . Dyslipidemia (high LDL; low HDL) 10/20/2014  . History of exercise stress test    ETT 10-11-2015 normal  . History of ST elevation myocardial infarction (STEMI) 10/20/2014,  EMS shocked pt x1 for VFib   inferior wall per cardic cath --  occlusion distal LAD with typical angiographic appearance of spontaneous coronary arter dissection,  medical therapy recommended  . Hypertension   . Palpitations    event monitor 12-03-2017 rare PVCs, PACs with short SVT runs,  per dr cooper result note,  benign  . Peyronie's disease   . Wears glasses     Past Surgical History:  Procedure Laterality Date  . LEFT HEART CATHETERIZATION WITH CORONARY ANGIOGRAM N/A 10/20/2014   Procedure: LEFT HEART CATHETERIZATION WITH CORONARY ANGIOGRAM;  Surgeon: Blane Ohara, MD; L main OK, LAD 100% distal (dissection), CFX 0%, RCA 0%, EF 50-55%   . NESBIT PROCEDURE N/A 10/15/2018   Procedure: NESBIT PROCEDURE;  Surgeon: Alexis Frock, MD;  Location: [facility];  Service: Urology;  Laterality: N/A;  . NO PAST SURGERIES       Current Outpatient Medications  Medication Sig Dispense Refill  . amLODipine (NORVASC) 5 MG tablet Take 1 tablet (5 mg total) by mouth daily. 90 tablet 3  . aspirin EC 81 MG tablet Take 81 mg by mouth daily.    Marland Kitchen atorvastatin (LIPITOR) 20 MG tablet Take 1 tablet (20 mg total) by mouth daily at 6 PM. 90 tablet 3  . carvedilol (COREG) 12.5 MG tablet TAKE 1 TABLET(12.5 MG) BY MOUTH TWICE DAILY WITH A MEAL. 180 tablet 3  . nitroGLYCERIN (NITROSTAT) 0.4 MG SL tablet Place 1 tablet (0.4 mg total) under the tongue every 5 (five) minutes as needed for chest pain. 25 tablet 3   No current facility-administered medications for this visit.    Allergies:   Patient has no known allergies.    Social History:  The patient  reports that he has never smoked. He has never used smokeless tobacco.  He reports that he does not drink alcohol and does not use drugs.   Family History:  The patient's family history includes Asthma in his mother and son; Hypertension in his mother; Stroke in his brother.    ROS:  Please see the history of present illness. Otherwise, review of systems are positive for none.   All other systems are reviewed and negative.    PHYSICAL EXAM: VS:  BP 112/80   Pulse 71   Ht 5\' 11"  (1.803 m)   Wt 222 lb 3.2 oz (100.8 kg)   BMI 30.99 kg/m  , BMI Body mass index is 30.99 kg/m.   General: Well developed, well nourished, NAD Neck: Negative for carotid  bruits. No JVD Lungs:Clear to ausculation bilaterally. No wheezes, rales, or rhonchi. Breathing is unlabored. Cardiovascular: RRR with S1 S2. No murmurs Extremities: No edema. Neuro: Alert and oriented. No focal deficits. No facial asymmetry. MAE spontaneously. Psych: Responds to questions appropriately with normal affect.     EKG:  EKG is ordered today. The ekg ordered today demonstrates NSR    Recent Labs: No results found for requested labs within last 8760 hours.    Lipid Panel    Component Value Date/Time   CHOL 134 12/03/2017 0806   TRIG 59 12/03/2017 0806   HDL 44 12/03/2017 0806   CHOLHDL 3.0 12/03/2017 0806   CHOLHDL 3 11/24/2014 0835   VLDL 16.6 11/24/2014 0835   LDLCALC 78 12/03/2017 0806     Wt Readings from Last 3 Encounters:  06/27/20 222 lb 3.2 oz (100.8 kg)  06/27/19 207 lb 12.8 oz (94.3 kg)  10/15/18 226 lb 3.2 oz (102.6 kg)    Other studies Reviewed: Additional studies/ records that were reviewed today include:  Review of the above records demonstrates:   Echocardiogram 10/22/2014:  - Left ventricle: The cavity size was normal. Wall thickness was  normal. Systolic function was normal. The estimated ejection  fraction was in the range of 55% to 60%. Although no diagnostic  regional wall motion abnormality was identified, this possibility  cannot be completely excluded on the basis of this study. Doppler  parameters are consistent with abnormal left ventricular  relaxation (grade 1 diastolic dysfunction).  - Aortic valve: There was trivial regurgitation. Valve area (Vmax):  3.25 cm^2.   ASSESSMENT AND PLAN:  1. CAD: -Denies anginal symptoms -Continue current regimen with no change -ASA, statin, beta-blocker  2. Hypertension: -Stable, 112/80 -Continue amlodipine, carvedilol  3. HLD: -Obtain lipid panel, CMET -Continue atorvastatin 20 mg  4. Palpitations: -Denies recurrence -EKG stable today with no PVCs, PACs   Current  medicines are reviewed at length with the patient today.  The patient does not have concerns regarding medicines.  The following changes have been made:  no change  Labs/ tests ordered today include: CMET, CBC, Lipid  No orders of the defined types were placed in this encounter.   Disposition:   FU with Dr. Burt Knack in 1 year  Signed, Kathyrn Drown, NP  06/27/2020 12:03 PM    [address], [place], [place]  [address] Phone: [phone]; Fax: [phone]

## 2020-06-27 ENCOUNTER — Encounter: Payer: Self-pay | Admitting: Cardiology

## 2020-06-27 ENCOUNTER — Ambulatory Visit: Payer: BC Managed Care – PPO | Admitting: Cardiology

## 2020-06-27 ENCOUNTER — Other Ambulatory Visit: Payer: Self-pay

## 2020-06-27 VITALS — BP 112/80 | HR 71 | Ht 71.0 in | Wt 222.2 lb

## 2020-06-27 DIAGNOSIS — I2510 Atherosclerotic heart disease of native coronary artery without angina pectoris: Secondary | ICD-10-CM | POA: Diagnosis not present

## 2020-06-27 DIAGNOSIS — I10 Essential (primary) hypertension: Secondary | ICD-10-CM

## 2020-06-27 DIAGNOSIS — I2102 ST elevation (STEMI) myocardial infarction involving left anterior descending coronary artery: Secondary | ICD-10-CM | POA: Diagnosis not present

## 2020-06-27 DIAGNOSIS — E782 Mixed hyperlipidemia: Secondary | ICD-10-CM

## 2020-06-27 LAB — LIPID PANEL
Chol/HDL Ratio: 3.6 ratio (ref 0.0–5.0)
Cholesterol, Total: 191 mg/dL (ref 100–199)
HDL: 53 mg/dL (ref >39)
LDL Chol Calc (NIH): 121 mg/dL — ABNORMAL HIGH (ref 0–99)
Triglycerides: 92 mg/dL (ref 0–149)
VLDL Cholesterol Cal: 17 mg/dL (ref 5–40)

## 2020-06-27 LAB — CBC
Hematocrit: 37.5 % (ref 37.5–51.0)
Hemoglobin: 12.7 g/dL — ABNORMAL LOW (ref 13.0–17.7)
MCH: 31.4 pg (ref 26.6–33.0)
MCHC: 33.9 g/dL (ref 31.5–35.7)
MCV: 93 fL (ref 79–97)
Platelets: 303 10*3/uL (ref 150–450)
RBC: 4.05 x10E6/uL — ABNORMAL LOW (ref 4.14–5.80)
RDW: 11.2 % — ABNORMAL LOW (ref 11.6–15.4)
WBC: 5.5 10*3/uL (ref 3.4–10.8)

## 2020-06-27 LAB — COMPREHENSIVE METABOLIC PANEL
ALT: 28 IU/L (ref 0–44)
AST: 22 IU/L (ref 0–40)
Albumin/Globulin Ratio: 1.2 (ref 1.2–2.2)
Albumin: 4.1 g/dL (ref 3.8–4.9)
Alkaline Phosphatase: 50 IU/L (ref 44–121)
BUN/Creatinine Ratio: 14 (ref 9–20)
BUN: 17 mg/dL (ref 6–24)
Bilirubin Total: 0.5 mg/dL (ref 0.0–1.2)
CO2: 24 mmol/L (ref 20–29)
Calcium: 9.4 mg/dL (ref 8.7–10.2)
Chloride: 102 mmol/L (ref 96–106)
Creatinine, Ser: 1.18 mg/dL (ref 0.76–1.27)
GFR calc Af Amer: 78 mL/min/{1.73_m2} (ref >59)
GFR calc non Af Amer: 68 mL/min/{1.73_m2} (ref >59)
Globulin, Total: 3.5 g/dL (ref 1.5–4.5)
Glucose: 109 mg/dL — ABNORMAL HIGH (ref 65–99)
Potassium: 4.2 mmol/L (ref 3.5–5.2)
Sodium: 139 mmol/L (ref 134–144)
Total Protein: 7.6 g/dL (ref 6.0–8.5)

## 2020-06-27 NOTE — Patient Instructions (Signed)
Medication Instructions:   Your physician recommends that you continue on your current medications as directed. Please refer to the Current Medication list given to you today.  *If you need a refill on your cardiac medications before your next appointment, please call your pharmacy*   Lab Work: Non fasting lipid panel, CMET, and CBC today. If you have labs (blood work) drawn today and your tests are completely normal, you will receive your results only by: Marland Kitchen MyChart Message (if you have MyChart) OR . A paper copy in the mail If you have any lab test that is abnormal or we need to change your treatment, we will call you to review the results.   Testing/Procedures: None   Follow-Up: At [facility], you and your health needs are our priority.  As part of our continuing mission to provide you with exceptional heart care, we have created designated Provider Care Teams.  These Care Teams include your primary Cardiologist (physician) and Advanced Practice Providers (APPs -  Physician Assistants and Nurse Practitioners) who all work together to provide you with the care you need, when you need it.  We recommend signing up for the patient portal called "MyChart".  Sign up information is provided on this After Visit Summary.  MyChart is used to connect with patients for Virtual Visits (Telemedicine).  Patients are able to view lab/test results, encounter notes, upcoming appointments, etc.  Non-urgent messages can be sent to your provider as well.   To learn more about what you can do with MyChart, go to [email or web address].    Your next appointment:   1 year(s)  The format for your next appointment:   In Person  Provider:   Sherren Mocha, MD

## 2020-08-24 ENCOUNTER — Other Ambulatory Visit: Payer: Self-pay | Admitting: Cardiovascular Disease

## 2020-09-23 ENCOUNTER — Other Ambulatory Visit: Payer: Self-pay | Admitting: Cardiovascular Disease

## 2020-10-03 ENCOUNTER — Other Ambulatory Visit: Payer: Self-pay | Admitting: Cardiovascular Disease

## 2020-10-04 ENCOUNTER — Encounter (HOSPITAL_COMMUNITY): Payer: Self-pay

## 2020-10-04 ENCOUNTER — Emergency Department
Admission: EM | Admit: 2020-10-04 | Discharge: 2020-10-04 | Disposition: A | Discharge status: Home / Self Care | Payer: Medicaid Other | Attending: Emergency Medicine | Admitting: Emergency Medicine

## 2020-10-04 ENCOUNTER — Other Ambulatory Visit: Payer: Self-pay

## 2020-10-04 DIAGNOSIS — F17200 Nicotine dependence, unspecified, uncomplicated: Secondary | ICD-10-CM | POA: Insufficient documentation

## 2020-10-04 DIAGNOSIS — L723 Sebaceous cyst: Secondary | ICD-10-CM | POA: Insufficient documentation

## 2020-10-04 MED ORDER — CEPHALEXIN 500 MG CAPSULE
500.00 mg | ORAL_CAPSULE | Freq: Three times a day (TID) | ORAL | 0 refills | Status: AC
Start: 2020-10-04 — End: 2020-10-14

## 2020-10-04 NOTE — ED Triage Notes (Signed)
Patient c/o "cyst" around his right eye for a "long time"

## 2020-10-04 NOTE — ED Nurses Note (Signed)
Discharged with verbal and written instructions given. Pt verbalized understanding.

## 2020-10-04 NOTE — ED Provider Notes (Signed)
[facility]  DEPARTMENT OF EMERGENCY MEDICINE  EMERGENCY DEPARTMENT HISTORY AND PHYSICAL      Chief Complaint:    Patient is a 60 y.o.  male presenting to the ED with chief complaint of lump around right eye     History of Present Illness:    Patient indicates over the last few weeks has noticed a lump around the right eye area that seems to be getting bigger.  Starting to bother him.  Had an area on his back once before that had to be removed.  No fevers noted.  No change in vision.                Review of Systems:    Constitutional: No fever, chills  Skin: No rashes as per the HPI  HENT: No sore throat, ear pain, or difficulty swallowing  Eyes: No vision changes, redness, discharge  Cardio: No chest pain, palpitations   Respiratory: No cough, wheezing or SOB  GI:  No nausea or vomiting. No diarrhea or constipation. No abdominal pain  GU:  No dysuria, hematuria, polyuria  MSK: No joint pain.  No neck or back pain  Neuro: No numbness, tingling, or weakness.  No headache  Psych:Normal mood  All other systems were reviewed and were negative except for what is mentioned in the HPI.    Past Medical History:  History reviewed. No pertinent past medical history.  History reviewed. No pertinent surgical history.    Above history reviewed with patient.  Allergies, medication list, and old records also reviewed.     There is no problem list on file for this patient.      Social History:    Social History     Tobacco Use   . Smoking status: Current Every Day Smoker   . Smokeless tobacco: Current User     Types: Chew   Substance Use Topics   . Alcohol use: Yes     Alcohol/week: 4.0 standard drinks     Types: 4 Cans of beer per week   . Drug use: Never         Filed Vitals:    10/04/20 0803   BP: 134/89   Pulse: 90   Resp: 18   Temp: 36.2 C (97.1 F)   SpO2: 99%       Physical Exam:     Nursing note and vitals reviewed.  Vital signs reviewed as above. No acute distress.   Constitutional: Pt is well-developed and  well-nourished.   Head: Normocephalic and atraumatic.  Patient demonstrates approximately a 1 cm cyst to the temporal side of the right eye.    Eyes: Conjunctivae are normal. Pupils are equal, round, and reactive to light. EOM are intact    Neck: Soft, supple, full range of motion.  Heart: Normal rate no murmurs  Pulmonary/Chest: No respiratory distress clear to auscultation bilaterally    Neurological: No focal deficits noted.  Skin:  Patient with what appears to be sebaceous cyst to the lateral aspect of the right eye no marked redness at this time or induration.  Psychiatric: Patient has a normal mood and affect.       Labs:      No results found for any visits on 10/04/20.    Imaging:         Orders Placed This Encounter   . FOLLOW-UP: GENERAL SURGERY - [facility] - [place], [place]   . cephalexin (KEFLEX) 500 mg Oral Capsule  MDM:     patient presents emergency room with a cystic area on the right temporal aspect of his face will place patient on Keflex at this time will refer to surgery for follow-up possible removal.                     During the patient's stay in the emergency department, the above listed imaging and/or labs were performed to assist with medical decision making and were reviewed by myself as available for review.    Patient rechecked and remained stable throughout the emergency department course.      Results discussed with patient.    All questions/concerns addressed, and patient agrees with disposition plan.    Advised that patient return to ED immediately for any new or worsening symptoms; otherwise, follow up with PCP.    IMPRESSION:   Encounter Diagnosis   Name Primary?   . Sebaceous cyst Yes           DISPOSITION/PLAN:    Discharged  Catalina Lunger., DO  [address]  [place] [address]  [phone]    Schedule an appointment as soon as possible for a visit       General Surgery, Building A  [address]  [place]  [address]  [phone]

## 2020-11-08 ENCOUNTER — Emergency Department
Admission: EM | Admit: 2020-11-08 | Discharge: 2020-11-08 | Disposition: A | Discharge status: Home / Self Care | Payer: Medicaid Other | Attending: Emergency Medicine | Admitting: Emergency Medicine

## 2020-11-08 ENCOUNTER — Encounter (HOSPITAL_COMMUNITY): Payer: Self-pay

## 2020-11-08 ENCOUNTER — Other Ambulatory Visit: Payer: Self-pay

## 2020-11-08 DIAGNOSIS — F17200 Nicotine dependence, unspecified, uncomplicated: Secondary | ICD-10-CM | POA: Insufficient documentation

## 2020-11-08 DIAGNOSIS — L723 Sebaceous cyst: Secondary | ICD-10-CM | POA: Insufficient documentation

## 2020-11-08 MED ORDER — LIDOCAINE HCL 10 MG/ML (1 %) INJECTION SOLUTION
INTRAMUSCULAR | Status: AC
Start: 2020-11-08 — End: 2020-11-08
  Filled 2020-11-08: qty 20

## 2020-11-08 NOTE — ED Nurses Note (Signed)
At bedside with Dr. Nicholaus Bloom for procedure  Approximately 44ml of cloudy white fluid aspirated from cyst

## 2020-11-08 NOTE — ED Triage Notes (Signed)
C/o cyst to outer corner of right eye

## 2020-11-08 NOTE — ED Provider Notes (Signed)
[facility]  DEPARTMENT OF EMERGENCY MEDICINE  EMERGENCY DEPARTMENT HISTORY AND PHYSICAL      Chief Complaint:    Patient is a 60 y.o.  male presenting to the ED with chief complaint of cyst to the right of his right eye   History of Present Illness:  Patient indicates over the last few wheezes headache increasing sits like area developing next to his right eye which is starting to bother him when he wants to read.  No other complaints at this time it is not causing him any pain.                  Review of Systems:    Constitutional: No fever, chills  Skin: No rashes as per the HPI  HENT: No sore throat, ear pain, or difficulty swallowing  Eyes: No vision changes, redness, discharge  Neuro: No numbness, tingling, or weakness.  No headache  Psych: I. Normal mood  All other systems were reviewed and were negative except for what is mentioned in the HPI.    Past Medical History:  History reviewed. No pertinent past medical history.  History reviewed. No pertinent surgical history.    Above history reviewed with patient.  Allergies, medication list, and old records also reviewed.     There is no problem list on file for this patient.      Social History:    Social History     Tobacco Use    Smoking status: Current Every Day Smoker    Smokeless tobacco: Current User     Types: Chew   Substance Use Topics    Alcohol use: Yes     Alcohol/week: 4.0 standard drinks     Types: 4 Cans of beer per week    Drug use: Never         Filed Vitals:    11/08/20 0838   BP: (!) 146/89   Pulse: 91   Resp: 16   Temp: 36.7 C (98 F)   SpO2: 99%       Physical Exam:     Nursing note and vitals reviewed.  Vital signs reviewed as above. No acute distress.   Constitutional: Pt is well-developed and well-nourished.   Head: Normocephalic and atraumatic.  Patient demonstrates 1 cm size cyst to lateral aspect of the right eye no redness no warmth no tenderness is movable and feels fluctuant  Eyes: Conjunctivae are normal. Pupils are equal,  round, and reactive to light. EOM are intact    Neurological: No focal deficits noted.  Skin:  As noted above  Psychiatric: Patient has a normal mood and affect.       Labs:      No results found for any visits on 11/08/20.    Imaging:         No orders of the defined types were placed in this encounter.          MDM:     patient cystic area to the right of the right eye.    Procedure note:    The skin over the cyst was prepped .  Then 1% lidocaine without epi 0.2 cc infused the site was then aspirated with 18 gauge needle small amount of fluid and white T-shirt material was removed.  Cystic area deflated about 50% explained to the patient that the material would not come through the needle at this time and will refer him to surgery Dr. For removal of the cyst.  During the patient's stay in the emergency department, the above listed imaging and/or labs were performed to assist with medical decision making and were reviewed by myself as available for review.    Patient rechecked and remained stable throughout the emergency department course.      Results discussed with patient.    All questions/concerns addressed, and patient agrees with disposition plan.    Advised that patient return to ED immediately for any new or worsening symptoms; otherwise, follow up with PCP.    IMPRESSION:   No diagnosis found.        DISPOSITION/PLAN:    Data Unavailable  No follow-up provider specified.

## 2020-11-08 NOTE — ED Nurses Note (Signed)
Pt given discharge instructions. Pt verbalized understanding with no questions asked. No s/sx of distress at d/c. Pt ambulatory to exit.

## 2020-11-12 ENCOUNTER — Other Ambulatory Visit: Payer: Self-pay

## 2020-11-12 ENCOUNTER — Encounter (INDEPENDENT_AMBULATORY_CARE_PROVIDER_SITE_OTHER): Payer: Self-pay | Admitting: GENERAL SURGERY

## 2020-11-12 ENCOUNTER — Ambulatory Visit: Payer: Medicaid Other | Attending: GENERAL SURGERY | Admitting: GENERAL SURGERY

## 2020-11-12 VITALS — BP 140/86 | HR 125 | Temp 95.7°F | Resp 20 | Ht 70.0 in | Wt 180.9 lb

## 2020-11-12 DIAGNOSIS — L720 Epidermal cyst: Secondary | ICD-10-CM | POA: Insufficient documentation

## 2020-11-12 MED ORDER — DOXYCYCLINE HYCLATE 100 MG CAPSULE
100.00 mg | ORAL_CAPSULE | Freq: Two times a day (BID) | ORAL | 0 refills | Status: AC
Start: 2020-11-12 — End: 2020-11-19

## 2020-11-12 NOTE — H&P (Signed)
[facility], Surgery Dept - [facility]   [address]  [place] [address]  History and Physical    Name: Thomas Esparza  MRN: [id number]  DOB:Aug 24, 1961  Date:   11/12/2020  PCP: No Pcp    Chief Complaint:   Chief Complaint   Patient presents with   . Cyst     Right periorbital        History of Present Illness: Thomas Esparza is a 60 y.o. male who presented to the emergency room 5 days ago complaining a swollen cyst..  It was a small infected cyst which was drained in the ED.  The patient follows up for my evaluation.  He denies pain in the area and also denies discharge.  He was not given antibiotics.    Past Medical/Surgical History:  History reviewed. No pertinent past medical history.  History reviewed. No pertinent surgical history.     Current Medications:  No outpatient medications have been marked as taking for the 11/12/20 encounter (Office Visit) with Rebbeca Paul, MD.     Allergies:  No Known Allergies  Social History:  Social History     Tobacco Use   . Smoking status: Current Every Day Smoker   . Smokeless tobacco: Current User     Types: Chew   Substance Use Topics   . Alcohol use: Yes     Alcohol/week: 4.0 standard drinks     Types: 4 Cans of beer per week      Family History:  Family Medical History:    None        Review of Systems:  Constitutional:: negative     Eyes:: negative     Ears, nose, mouth, and throat:: positive for      +: nasal congestion  Respiratory:: negative     Cardiovascular:: negative     Gastrointestinal:: negative     Genitourinary:: negative     Integument/breast:: negative     Hematologic/lymphatic:: negative     Musculoskeletal:: negative     Neurological:: negative     Behavioral/Psych:: negative     Endocrine:: negative     Allergic/Immunologic:: positive for      +: seasonal allergies       Other than ROS in the HPI, all other systems were negative.  Constitutional: negative for fevers, chills and anorexia  Respiratory: negative for cough,  sputum or hemoptysis  Cardiovascular: negative for chest pain, dyspnea and palpitations  Gastrointestinal: negative for nausea, vomiting, change in bowel habits, melena, diarrhea, constipation and abdominal pain  Genitourinary:negative for frequency and dysuria  Integument/breast: negative for rash and skin lesion(s)  Musculoskeletal:negative for back pain and muscle weakness      Physical Exam  BP (!) 140/86   Pulse (!) 125   Temp 35.4 C (95.7 F)   Resp 20   Ht 1.778 m (5\' 10" )   Wt 82.1 kg (180 lb 14.4 oz)   SpO2 97%   BMI 25.96 kg/m        General: Pleasant male in no acute distress  HEENT:  8 mm cyst just right  to the lateral canthus, there is a small amount of yellow purulence emanating from the central spot.  It is nontender and without induration and there is no local or lid involvement  Heart: RRR, no murmurs, rubs or gallops  Lungs: Clear to auscultation bilaterally, normal respiratory effort.  Abdomen: Soft, nontender, nondistended, no masses palpable, no hernia, liver and spleen,not palpable, no  palpable groin nodes.  Skin: No rashes or induration.  Extremities: Patient appears able to walk without difficulty. Hands show no clubbing or cyanosis. 2+ pulses throughout all extremities, no edema.  Breast:Examination of the breast deferred    Assessment:     Previously drained infected inclusion cyst near right eye.  Still has some local erythema and required antibiotics.  Once the infection has resolved I recommended excision.    Plan:     Doxycycline for 1 week.    Patient follow-up with me then.    Rebbeca Paul, MD

## 2020-11-12 NOTE — Nursing Note (Signed)
Patient is a 60 y.o. male is in office for right periorbital cyst. Patient has history of cysts. He is not taking an antibiotic at this time.     Rosalita Levan, LPN  05/16/538, 15:04

## 2020-11-19 ENCOUNTER — Ambulatory Visit: Payer: Medicaid Other | Attending: GENERAL SURGERY | Admitting: GENERAL SURGERY

## 2020-11-19 ENCOUNTER — Encounter (INDEPENDENT_AMBULATORY_CARE_PROVIDER_SITE_OTHER): Payer: Self-pay | Admitting: GENERAL SURGERY

## 2020-11-19 ENCOUNTER — Other Ambulatory Visit: Payer: Self-pay

## 2020-11-19 VITALS — BP 162/96 | HR 108 | Temp 96.8°F | Resp 18 | Ht 70.0 in | Wt 179.6 lb

## 2020-11-19 DIAGNOSIS — L720 Epidermal cyst: Secondary | ICD-10-CM

## 2020-11-19 DIAGNOSIS — L729 Follicular cyst of the skin and subcutaneous tissue, unspecified: Secondary | ICD-10-CM | POA: Insufficient documentation

## 2020-11-19 NOTE — Nursing Note (Signed)
Patient is a 60 y.o. male is in office for 1 week follow up. Patient has finished antibiotics and states there is no improvement.  Rosalita Levan, LPN  1/49/7026, 15:06

## 2020-11-19 NOTE — Progress Notes (Signed)
[facility]  [address]  [place] [address]  [phone]    Name: JARRIUS HUARACHA  MRN: [id number]  Date:   11/19/2020  PCP: No Pcp    Chief Complaint: Other (1 week fu)    Subjective:  Patient completed his course of antibiotics he has had no drainage and local pain.    Current Outpatient Medications   Medication Sig    doxycycline hyclate (VIBRAMYCIN) 100 mg Oral Capsule Take 1 Capsule (100 mg total) by mouth Twice daily for 7 days (Patient not taking: Reported on 11/19/2020)     Objective:  BP (!) 162/96    Pulse (!) 108    Temp 36 C (96.8 F)    Resp 18    Ht 1.778 m (5\' 10" )    Wt 81.5 kg (179 lb 9.6 oz)    SpO2 97%    BMI 25.77 kg/m       1 cm cyst just lateral to the right lateral canthus without erythema and tenderness        Assessment    Infection resolved.  Ready for excision.    Plan    Will schedule in office local excision per patient's convenience.    , MD

## 2020-11-28 ENCOUNTER — Ambulatory Visit: Payer: Medicaid Other | Attending: GENERAL SURGERY | Admitting: GENERAL SURGERY

## 2020-11-28 ENCOUNTER — Encounter (INDEPENDENT_AMBULATORY_CARE_PROVIDER_SITE_OTHER): Payer: Self-pay | Admitting: GENERAL SURGERY

## 2020-11-28 ENCOUNTER — Other Ambulatory Visit: Payer: Self-pay

## 2020-11-28 VITALS — BP 142/91 | HR 88 | Temp 97.2°F | Resp 18 | Ht 70.0 in | Wt 180.0 lb

## 2020-11-28 DIAGNOSIS — L720 Epidermal cyst: Secondary | ICD-10-CM | POA: Insufficient documentation

## 2020-11-28 NOTE — Nursing Note (Signed)
Patient is a 60 y.o. male present for lesion removal to right side of eye. Swelling and itching reported  Carney Corners, LPN  02/21/6836, 29:02

## 2020-11-28 NOTE — Procedures (Signed)
GENERAL SURGERY, BUILDING A  [address]  [place] [address]  Operated by [facility]  Procedure Note    Name: Thomas Esparza MRN:  [id number]   Date: 11/28/2020 Age: 60 y.o.       Procedures     Preprocedure diagnosis:  Facial epidermal cyst    Postprocedure diagnosis:  Same    Procedure:  Excision of cyst    Anesthetic:  2 cc of 0.5% Sensorcaine with epinephrine    Description:  With the patient in the supine position I prepped the face just right lateral of the right eye with Betadine.  The field was draped sterilely.  Local analgesia was administered.  A 1 1/2 cm elliptical excision was then performed completely excising the 1 cm cyst.  I closed the excision site with 5 interrupted Prolene 5 0 suture.  Dermabond was used for the dressing.  The specimen was discarded.    Blood loss was scant and there were no complications.      Rebbeca Paul, MD

## 2020-12-06 ENCOUNTER — Other Ambulatory Visit: Payer: Self-pay

## 2020-12-06 ENCOUNTER — Ambulatory Visit: Payer: Medicaid Other | Attending: Physician Assistant | Admitting: Physician Assistant

## 2020-12-06 ENCOUNTER — Encounter (INDEPENDENT_AMBULATORY_CARE_PROVIDER_SITE_OTHER): Payer: Self-pay | Admitting: Physician Assistant

## 2020-12-06 VITALS — BP 185/100 | HR 102 | Temp 97.0°F | Resp 18 | Ht 70.0 in | Wt 180.0 lb

## 2020-12-06 DIAGNOSIS — Z4802 Encounter for removal of sutures: Secondary | ICD-10-CM | POA: Insufficient documentation

## 2020-12-06 NOTE — Nursing Note (Signed)
Patient is a 60 y.o. male present for f/u suture removal. Sutures intact . No issues with site  Carney Corners, LPN  1/51/7616, 10:52

## 2020-12-06 NOTE — Progress Notes (Signed)
[facility]/General Surgery   Progress Note  Abhinav, Mayorquin  MRN: [id number]  Date of Admission:  12/06/2020  Date of Birth:  1961-02-06      PCP: No Pcp  Chief Complaint:  Suture removal       HPI: Thomas Esparza is a 60 y.o. white male who presents to the clinic for suture removal. Patient is eight days s/p excision of facial epidermal cyst. This was performed on 11/28/20 in-office by Dr. Georgian Co.    Patient reports doing well. He had some redness initially after the excision but this has since subsided. He denies fever, chills, bleeding, or drainage.    His blood pressure in our office today was 185/100. He denies chest pain, shortness of breath, blurred vision, dizziness, or lightheadedness. He does not take blood pressure medications. He states he walked up the hill prior to his appointment today.      History reviewed. No pertinent past medical history.    Past Surgical History - none      No current outpatient medications on file.    No Known Allergies    Social History     Tobacco Use    Smoking status: Current Every Day Smoker    Smokeless tobacco: Current User     Types: Chew   Substance Use Topics    Alcohol use: Yes     Alcohol/week: 4.0 standard drinks     Types: 4 Cans of beer per week       Family History:  none    ROS: Other than ROS in the HPI, all other systems were negative.    EXAM:  BP (!) 185/100    Pulse (!) 102    Temp 36.1 C (97 F)    Resp 18    Ht 1.778 m (5\' 10" )    Wt 81.6 kg (180 lb)    SpO2 97%    BMI 25.83 kg/m       General: Pleasant male in no acute distress  Skin: Excision site lateral to right eye is clean, dry, intact, and well healed. There is no erythema, bleeding, drainage, fluctuance, or signs of infection. Area cleaned with alcohol prep pad. Sutures removed. No need for steri-strips.      Assessment: 60 y.o. male s/p excision of facial epidermal cyst.  Plan: Sutures removed in office today.  Advised patient to seek treatment at ED if he develops any of  above symptoms.  Recommended follow-up with a PCP to treat his high blood pressure.  Follow-up as needed as related to procedure.    46, PA-C    Blenda Nicely, MD

## 2021-04-02 ENCOUNTER — Other Ambulatory Visit: Payer: Self-pay | Admitting: Cardiovascular Disease

## 2021-04-03 ENCOUNTER — Other Ambulatory Visit: Payer: Self-pay | Admitting: Cardiovascular Disease

## 2021-05-25 ENCOUNTER — Emergency Department (HOSPITAL_COMMUNITY)
Admission: EM | Admit: 2021-05-25 | Discharge: 2021-05-25 | Disposition: A | Discharge status: Home / Self Care | Payer: BC Managed Care – PPO | Attending: Emergency Medicine | Admitting: Emergency Medicine

## 2021-05-25 ENCOUNTER — Encounter (HOSPITAL_COMMUNITY): Payer: Self-pay | Admitting: Emergency Medicine

## 2021-05-25 DIAGNOSIS — Z23 Encounter for immunization: Secondary | ICD-10-CM | POA: Diagnosis not present

## 2021-05-25 DIAGNOSIS — Z7982 Long term (current) use of aspirin: Secondary | ICD-10-CM | POA: Diagnosis not present

## 2021-05-25 DIAGNOSIS — W268XXA Contact with other sharp object(s), not elsewhere classified, initial encounter: Secondary | ICD-10-CM | POA: Insufficient documentation

## 2021-05-25 DIAGNOSIS — S61422A Laceration with foreign body of left hand, initial encounter: Secondary | ICD-10-CM

## 2021-05-25 DIAGNOSIS — I10 Essential (primary) hypertension: Secondary | ICD-10-CM | POA: Insufficient documentation

## 2021-05-25 DIAGNOSIS — I2510 Atherosclerotic heart disease of native coronary artery without angina pectoris: Secondary | ICD-10-CM | POA: Diagnosis not present

## 2021-05-25 DIAGNOSIS — Z79899 Other long term (current) drug therapy: Secondary | ICD-10-CM | POA: Diagnosis not present

## 2021-05-25 DIAGNOSIS — S61412A Laceration without foreign body of left hand, initial encounter: Secondary | ICD-10-CM | POA: Diagnosis not present

## 2021-05-25 DIAGNOSIS — S6992XA Unspecified injury of left wrist, hand and finger(s), initial encounter: Secondary | ICD-10-CM | POA: Diagnosis not present

## 2021-05-25 MED ORDER — TETANUS-DIPHTH-ACELL PERTUSSIS 5-2.5-18.5 LF-MCG/0.5 IM SUSY
0.5000 mL | PREFILLED_SYRINGE | Freq: Once | INTRAMUSCULAR | Status: AC
  Administered 2021-05-25: 0.5 mL via INTRAMUSCULAR
  Filled 2021-05-25: qty 0.5

## 2021-05-25 MED ORDER — LIDOCAINE HCL (PF) 1 % IJ SOLN
30.0000 mL | Freq: Once | INTRAMUSCULAR | Status: AC
  Administered 2021-05-25: 30 mL
  Filled 2021-05-25: qty 30
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
## 2021-05-25 NOTE — ED Provider Notes (Signed)
Emergency Medicine Provider Triage Evaluation Note  Alan Walker , a 60 y.o. male  was evaluated in triage.  Pt complains of laceration to left hand after he cut it on the propeller of his model airplane. Patient denies any pain, bleeding is controlled. Unknown date of last tetanus shot.  Review of Systems  Positive:  Negative: Fevers, chills  Physical Exam  BP 139/88   Pulse 76   Temp 98.6 F (37 C) (Oral)   Resp 16   SpO2 97%  Gen:   Awake, no distress   Resp:  Normal effort  MSK:   Moves extremities without difficulty. 4 superficial lacerations noted to the proximal posterior hand. Sensation in tact. Bleeding controlled. Full range of motion   Medical Decision Making  Medically screening exam initiated at 12:01 PM.  Appropriate orders placed.  Alan Walker was informed that the remainder of the evaluation will be completed by another provider, this initial triage assessment does not replace that evaluation, and the importance of remaining in the ED until their evaluation is complete.    Alan Walker 05/25/21 1208    Luna Fuse, MD 05/27/21 [phone]

## 2021-05-25 NOTE — ED Triage Notes (Signed)
Laceration to L middle finger/knuckle.  States the propeller on his model plane came on and hit his finger.  Denies pain.  CMS intact.

## 2021-05-27 ENCOUNTER — Emergency Department (HOSPITAL_COMMUNITY): Payer: Medicaid Other

## 2021-05-27 ENCOUNTER — Other Ambulatory Visit: Payer: Self-pay

## 2021-05-27 ENCOUNTER — Encounter (HOSPITAL_COMMUNITY): Payer: Self-pay

## 2021-05-27 ENCOUNTER — Emergency Department
Admission: EM | Admit: 2021-05-27 | Discharge: 2021-05-27 | Disposition: A | Discharge status: Home / Self Care | Payer: Medicaid Other | Attending: Physician Assistant | Admitting: Physician Assistant

## 2021-05-27 DIAGNOSIS — S50311A Abrasion of right elbow, initial encounter: Secondary | ICD-10-CM | POA: Insufficient documentation

## 2021-05-27 DIAGNOSIS — F17200 Nicotine dependence, unspecified, uncomplicated: Secondary | ICD-10-CM | POA: Insufficient documentation

## 2021-05-27 DIAGNOSIS — M19021 Primary osteoarthritis, right elbow: Secondary | ICD-10-CM | POA: Insufficient documentation

## 2021-05-27 DIAGNOSIS — W010XXA Fall on same level from slipping, tripping and stumbling without subsequent striking against object, initial encounter: Secondary | ICD-10-CM | POA: Insufficient documentation

## 2021-05-27 DIAGNOSIS — S5001XA Contusion of right elbow, initial encounter: Secondary | ICD-10-CM | POA: Insufficient documentation

## 2021-05-27 DIAGNOSIS — T148XXA Other injury of unspecified body region, initial encounter: Secondary | ICD-10-CM

## 2021-05-27 MED ORDER — LIDOCAINE HCL 10 MG/ML (1 %) INJECTION SOLUTION
2.0000 mL | Freq: Once | INTRAMUSCULAR | Status: AC
Start: 2021-05-27 — End: 2021-05-27
  Administered 2021-05-27: 20 mg via INTRADERMAL
  Filled 2021-05-27: qty 20

## 2021-05-27 MED ORDER — CEPHALEXIN 500 MG CAPSULE
500.00 mg | ORAL_CAPSULE | Freq: Three times a day (TID) | ORAL | 0 refills | Status: AC
Start: 2021-05-27 — End: 2021-06-03

## 2021-05-27 NOTE — ED Provider Notes (Signed)
[facility]  DEPARTMENT OF EMERGENCY MEDICINE  EMERGENCY DEPARTMENT HISTORY AND PHYSICAL      Chief Complaint:    Patient is a 60 y.o.  male presenting to the ED with chief complaint of left elbow injury.     History of Present Illness:    Patient presents to the emergency department today complaining of right elbow swelling and bruising.  Patient states that he slipped and fell from standing position onto seem it 2 days ago.  Admits landed on right elbow.  Admits elbow has continued to swell.  Denies any fevers.  Denies any redness.  Denies any pain with movement of elbow.      Review of Systems:    Constitutional: No fever, chills  Skin: No rashes or lesions  MSK:  See above.  No neck or back pain  Neuro: No numbness, tingling, or weakness.  Psych: No SI or HI. Normal mood  All other symptoms reviewed and are negative, unless commented on in the HPI.     Past Medical History:  History reviewed. No pertinent past medical history.  History reviewed. No pertinent surgical history.    Above history reviewed with patient.  Allergies, medication list, and old records also reviewed.     Social History:    Social History     Tobacco Use   . Smoking status: Current Every Day Smoker   . Smokeless tobacco: Current User     Types: Chew   Substance Use Topics   . Alcohol use: Yes     Alcohol/week: 4.0 standard drinks     Types: 4 Cans of beer per week   . Drug use: Never       Vitals:    Filed Vitals:    05/27/21 1330   BP: (!) 158/96   Pulse: 96   Resp: 20   Temp: 37 C (98.6 F)   SpO2: 99%       Physical Exam:     Nursing note and vitals reviewed.  Vital signs reviewed as above. No acute distress.   Constitutional: Pt is well-developed and well-nourished.   Extremities:  Localized edema and ecchymosis to right elbow.  Also superficial abrasions noted.  Positive range of motion.  No erythema.  No pain with flexion or extension of elbow joint.  Musculoskeletal: Normal range of motion. No deformities.    Neurological: CNs 2-12  grossly intact.  No focal deficits noted.  Skin: No rash or lesions  Psychiatric: Patient has a normal mood and affect.       Labs:  No results found for any visits on 05/27/21.    Imaging:    Results for orders placed or performed during the hospital encounter of 05/27/21 (from the past 72 hour(s))   XR ELBOW RIGHT     Status: None    Narrative    Thomas Esparza    PROCEDURE DESCRIPTION: XR ELBOW RIGHT    TECHNIQUE: 4 views / 4 images submitted.    CLINICAL INDICATION: swelling and bruising from fall    There is mild degenerative osteoarthrosis. Joint alignment appears normal. No evidence of fracture or bone erosion. A small joint effusion is noted. There is abnormal dorsal soft tissue thickening.      Impression    1. No acute fracture.  2. Mild degenerative osteoarthrosis.  3. Dorsal soft tissue swelling.      Radiologist location ID: [id number]         Orders Placed This Encounter   .  XR ELBOW RIGHT   . lidocaine 1% injection   . cephalexin (KEFLEX) 500 mg Oral Capsule           MDM:     X-rays reviewed by myself and radiology report discussed with patient.  Illness process discussed with patient.  Swelling likely secondary to hematoma.  Pressure dressing applied.  Advised to follow up with PCP 2-3 days.  Return here for worsening symptoms or other concerns.  Patient was given the opportunity to ask any questions prior to discharge.  Patient was involved in medical decision making and is agreeable to treatment/discharge plan.  Patient is stable for discharge.     Impression:     Hematoma  Abrasion  Fall    Disposition/Plan:    DC home with RX for keflex      This note was partially generated using MModal Fluency Direct system, and there may be some incorrect words, spellings, and punctuation that were not noted in checking the note before saving.      [employer], PA-C

## 2021-05-27 NOTE — ED Triage Notes (Addendum)
Slipped and fell on cement x2 days ago  C/o R elbow pain  Swelling and ecchymosis noted

## 2021-05-27 NOTE — ED Nurses Note (Signed)
Patient discharged with written and verbal education pertaining to disease process. Verbally acknowledged understanding.   No questions or concerns at time of d/c. Ambulated to exit.

## 2021-06-01 ENCOUNTER — Other Ambulatory Visit: Payer: Self-pay

## 2021-06-01 ENCOUNTER — Emergency Department (HOSPITAL_COMMUNITY)
Admission: EM | Admit: 2021-06-01 | Discharge: 2021-06-01 | Disposition: A | Discharge status: Home / Self Care | Payer: BC Managed Care – PPO | Attending: Physician Assistant | Admitting: Physician Assistant

## 2021-06-01 DIAGNOSIS — Z4802 Encounter for removal of sutures: Secondary | ICD-10-CM | POA: Insufficient documentation

## 2021-06-01 DIAGNOSIS — I2510 Atherosclerotic heart disease of native coronary artery without angina pectoris: Secondary | ICD-10-CM | POA: Diagnosis not present

## 2021-06-01 DIAGNOSIS — I10 Essential (primary) hypertension: Secondary | ICD-10-CM | POA: Insufficient documentation

## 2021-06-01 DIAGNOSIS — Z79899 Other long term (current) drug therapy: Secondary | ICD-10-CM | POA: Diagnosis not present

## 2021-06-01 DIAGNOSIS — Z7982 Long term (current) use of aspirin: Secondary | ICD-10-CM | POA: Insufficient documentation

## 2021-06-01 NOTE — Discharge Instructions (Addendum)
1. Medications: usual home medications 2. Treatment: rest, drink plenty of fluids, keeps wounds clean and dry 3. Follow Up: Please return to the ER for signs of infection or other concerns

## 2021-06-01 NOTE — ED Provider Notes (Signed)
[facility] EMERGENCY DEPARTMENT Provider Note   CSN: [id number] Arrival date & time: 06/01/21  [id number]     History Chief Complaint  Patient presents with   Suture / Staple Removal    Alan Walker is a 60 y.o. male presents to the emergency department for suture removal.  Patient presented on 05/25/2021 with laceration to the top of the left hand after being struck with the plastic blades of a motorized airplane.  9 sutures were placed at that time.  Patient denies pain, oozing or other concerns.  Also denies fever or chills.  Takes aspirin daily.  The history is provided by the patient and medical records. No language interpreter was used.      Past Medical History:  Diagnosis Date   CAD (coronary artery disease) cardiologist-  dr Burt Knack   10-20-2014 cardiac cath--  occluded distal LAD typical appearance of spontaneous dissection with intramural hematoma present, ef 50-55% with inferior-apical AK   Dyslipidemia (high LDL; low HDL) 10/20/2014   History of exercise stress test    ETT 10-11-2015 normal   History of ST elevation myocardial infarction (STEMI) 10/20/2014,  EMS shocked pt x1 for VFib   inferior wall per cardic cath --  occlusion distal LAD with typical angiographic appearance of spontaneous coronary arter dissection,  medical therapy recommended   Hypertension    Palpitations    event monitor 12-03-2017 rare PVCs, PACs with short SVT runs,  per dr cooper result note, benign   Peyronie's disease    Wears glasses     Patient Active Problem List   Diagnosis Date Noted   Coronary artery dissection 10/30/2014   Hypokalemia 10/23/2014   Dyslipidemia (high LDL; low HDL)    Hypertension    ST elevation (STEMI) myocardial infarction involving left anterior descending coronary artery ([place]) 10/20/2014    Past Surgical History:  Procedure Laterality Date   LEFT HEART CATHETERIZATION WITH CORONARY ANGIOGRAM N/A 10/20/2014   Procedure: LEFT HEART  CATHETERIZATION WITH CORONARY ANGIOGRAM;  Surgeon: Blane Ohara, MD; L main OK, LAD 100% distal (dissection), CFX 0%, RCA 0%, EF 50-55%    NESBIT PROCEDURE N/A 10/15/2018   Procedure: NESBIT PROCEDURE;  Surgeon: Alexis Frock, MD;  Location: [facility];  Service: Urology;  Laterality: N/A;   NO PAST SURGERIES         Family History  Problem Relation Age of Onset   Hypertension Mother    Asthma Mother    Asthma Son    Stroke Brother    Heart attack Neg Hx     Social History   Tobacco Use   Smoking status: Never   Smokeless tobacco: Never  Vaping Use   Vaping Use: Never used  Substance Use Topics   Alcohol use: No   Drug use: Never    Home Medications Prior to Admission medications   Medication Sig Start Date End Date Taking? Authorizing Provider  amLODipine (NORVASC) 5 MG tablet TAKE 1 TABLET(5 MG) BY MOUTH DAILY 10/03/20   Sherren Mocha, MD  aspirin EC 81 MG tablet Take 81 mg by mouth daily.    [provider]  atorvastatin (LIPITOR) 20 MG tablet Take 1 tablet (20 mg total) by mouth daily at 6 PM. Please make yearly appt with Dr. Burt Knack for October 2022 for future refills. Thank you 1st attempt 04/03/21   Sherren Mocha, MD  carvedilol (COREG) 12.5 MG tablet TAKE 1 TABLET(12.5 MG) BY MOUTH TWICE DAILY WITH A MEAL. 08/24/20  Sherren Mocha, MD  nitroGLYCERIN (NITROSTAT) 0.4 MG SL tablet Place 1 tablet (0.4 mg total) under the tongue every 5 (five) minutes as needed for chest pain. 10/23/14   Barrett, Evelene Croon, PA-C    Allergies    Patient has no known allergies.  Review of Systems   Review of Systems  Constitutional:  Negative for chills and fever.  Skin:  Positive for wound. Negative for color change.   Physical Exam Updated Vital Signs BP 128/90 (BP Location: Left Arm)   Pulse 67   Temp 98.2 F (36.8 C) (Oral)   Resp 20   SpO2 100%   Physical Exam Vitals and nursing note reviewed.  Constitutional:      General: He is not in  acute distress.    Appearance: He is well-developed. He is not ill-appearing.  HENT:     Head: Normocephalic.  Eyes:     General: No scleral icterus.    Conjunctiva/sclera: Conjunctivae normal.  Cardiovascular:     Rate and Rhythm: Normal rate.  Pulmonary:     Effort: Pulmonary effort is normal.  Abdominal:     General: There is no distension.  Musculoskeletal:        General: Normal range of motion.     Cervical back: Normal range of motion.  Skin:    General: Skin is warm and dry.     Comments: 3 lacerations with 9 sutures intact No erythema, induration or oozing.  Lacerations are clean and dry.  Neurological:     Mental Status: He is alert.  Psychiatric:        Mood and Affect: Mood normal.    ED Results / Procedures / Treatments     Procedures .Suture Removal  Date/Time: 06/01/2021 5:39 AM Performed by: Abigail Butts, PA-C Authorized by: Abigail Butts, PA-C   Consent:    Consent obtained:  Verbal   Consent given by:  Patient   Risks, benefits, and alternatives were discussed: yes     Risks discussed:  Bleeding, pain and wound separation   Alternatives discussed:  No treatment Universal protocol:    Procedure explained and questions answered to patient or proxy's satisfaction: yes     Relevant documents present and verified: yes     Test results available: yes     Imaging studies available: yes     Required blood products, implants, devices, and special equipment available: yes     Site/side marked: yes     Immediately prior to procedure, a time out was called: yes     Patient identity confirmed:  Verbally with patient Location:    Location:  Upper extremity   Upper extremity location:  Hand   Hand location:  L hand Procedure details:    Wound appearance:  No signs of infection, good wound healing and clean   Number of sutures removed:  9 Post-procedure details:    Post-removal:  Antibiotic ointment applied   Procedure completion:  Tolerated  well, no immediate complications   Medications Ordered in ED Medications - No data to display  ED Course  I have reviewed the triage vital signs and the nursing notes.  Pertinent labs & imaging results that were available during my care of the patient were reviewed by me and considered in my medical decision making (see chart for details).    MDM Rules/Calculators/A&P  Staple removal   Pt to ER for staple/suture removal and wound check as above. Procedure tolerated well. Vitals normal, no signs of infection. Scar minimization & return precautions given at dc.    Final Clinical Impression(s) / ED Diagnoses Final diagnoses:  Visit for suture removal    Rx / DC Orders ED Discharge Orders     None        Brycelyn Gambino, Gwenlyn Perking 06/01/21 0541    Orpah Greek, MD 06/01/21 [phone]

## 2021-06-01 NOTE — ED Triage Notes (Signed)
Pt here for suture removal to L hand. Site appears to have healed appropriately.

## 2021-06-06 ENCOUNTER — Other Ambulatory Visit: Payer: Self-pay | Admitting: Cardiovascular Disease

## 2021-07-06 ENCOUNTER — Other Ambulatory Visit: Payer: Self-pay | Admitting: Cardiovascular Disease

## 2021-10-07 ENCOUNTER — Other Ambulatory Visit: Payer: Self-pay | Admitting: Cardiovascular Disease

## 2021-10-08 ENCOUNTER — Other Ambulatory Visit: Payer: Self-pay | Admitting: Cardiovascular Disease

## 2021-11-12 ENCOUNTER — Other Ambulatory Visit: Payer: Self-pay | Admitting: Cardiovascular Disease

## 2021-11-15 ENCOUNTER — Telehealth: Payer: Self-pay | Admitting: Cardiovascular Disease

## 2021-11-15 DIAGNOSIS — Z1211 Encounter for screening for malignant neoplasm of colon: Secondary | ICD-10-CM

## 2021-11-15 NOTE — Telephone Encounter (Signed)
New message    Patient walked in to schedule an appointment with Dr Burt Knack.  While here, he asked if Dr Burt Knack could refer him to have a colonoscopy.  He is not having any problems, but because of his age and his older brother just had one done and told him he needed one.  Patient does not have a PCP.

## 2021-11-18 NOTE — Telephone Encounter (Signed)
Referral placed for GI and call placed to pt.

## 2021-11-18 NOTE — Telephone Encounter (Signed)
Yes. Please make referral to [facility] for screening colonoscopy. Thank you

## 2021-11-19 ENCOUNTER — Other Ambulatory Visit: Payer: Self-pay | Admitting: Cardiovascular Disease

## 2021-12-05 ENCOUNTER — Other Ambulatory Visit: Payer: Self-pay | Admitting: Cardiovascular Disease

## 2021-12-17 ENCOUNTER — Ambulatory Visit: Payer: BC Managed Care – PPO | Admitting: Cardiovascular Disease

## 2021-12-17 ENCOUNTER — Other Ambulatory Visit: Payer: Self-pay

## 2021-12-17 ENCOUNTER — Encounter: Payer: Self-pay | Admitting: Cardiovascular Disease

## 2021-12-17 ENCOUNTER — Encounter: Payer: Self-pay | Admitting: Gastroenterology

## 2021-12-17 VITALS — BP 116/84 | HR 73 | Ht 70.0 in | Wt 224.6 lb

## 2021-12-17 DIAGNOSIS — I2510 Atherosclerotic heart disease of native coronary artery without angina pectoris: Secondary | ICD-10-CM | POA: Diagnosis not present

## 2021-12-17 DIAGNOSIS — Z1211 Encounter for screening for malignant neoplasm of colon: Secondary | ICD-10-CM

## 2021-12-17 DIAGNOSIS — E782 Mixed hyperlipidemia: Secondary | ICD-10-CM

## 2021-12-17 DIAGNOSIS — I10 Essential (primary) hypertension: Secondary | ICD-10-CM

## 2021-12-17 NOTE — Patient Instructions (Signed)
Medication Instructions:  ?Your physician recommends that you continue on your current medications as directed. Please refer to the Current Medication list given to you today. ? ?*If you need a refill on your cardiac medications before your next appointment, please call your pharmacy* ? ? ?Lab Work: ?CBC, CMET, LIPIDS ?If you have labs (blood work) drawn today and your tests are completely normal, you will receive your results only by: ?MyChart Message (if you have MyChart) OR ?A paper copy in the mail ?If you have any lab test that is abnormal or we need to change your treatment, we will call you to review the results. ? ? ?Testing/Procedures: ?GI referral placed for screening colonoscopy ? ? ?Follow-Up: ?At [facility], you and your health needs are our priority.  As part of our continuing mission to provide you with exceptional heart care, we have created designated Provider Care Teams.  These Care Teams include your primary Cardiologist (physician) and Advanced Practice Providers (APPs -  Physician Assistants and Nurse Practitioners) who all work together to provide you with the care you need, when you need it. ? ?We recommend signing up for the patient portal called "MyChart".  Sign up information is provided on this After Visit Summary.  MyChart is used to connect with patients for Virtual Visits (Telemedicine).  Patients are able to view lab/test results, encounter notes, upcoming appointments, etc.  Non-urgent messages can be sent to your provider as well.   ?To learn more about what you can do with MyChart, go to [email or web address].   ? ?Your next appointment:   ?1 year(s) ? ?The format for your next appointment:   ?In Person ? ?Provider:   ?Sherren Mocha, MD   ? ? ?List of primary care physicians in our area provided to pt at this time ?  ?

## 2021-12-17 NOTE — Progress Notes (Signed)
?Cardiology Office Note:   ? ?Date:  12/17/2021  ? ?ID:  Alan Walker, DOB 08/19/1961, MRN [id number] ? ?PCP:  Patient, No Pcp Per (Inactive) ?  ?[facility] Prov[facility] ?Cardiologist:  Sherren Mocha, MD    ? ?Referring MD: No ref. provider found  ? ?Chief Complaint  ?Patient presents with  ? Coronary Artery Disease  ? ? ?History of Present Illness:   ? ?Alan Walker is a 61 y.o. male with a hx of CAD who initially presented in 2016 with an inferior wall MI.  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  Medical therapy was recommended at that time.  He has had no recurrent ischemic events.  He had previous complaints of heart palpitations and wore a Holter monitor which showed no evidence of arrhythmias, only occasional PACs and PVCs with no sustained runs. ?  ?The patient is here alone today.  He has been doing pretty well from a cardiac perspective.  He denies any chest pain, chest pressure, shortness of breath, heart palpitations, orthopnea, PND, or lightheadedness.  He still does some slow jogs but has developed some problems with his knees.  He does not have any symptoms at his current activity level.  The patient has not had a primary care physician.  He inquires about being referred for a screening colonoscopy which she has never had in the past. ? ?Past Medical History:  ?Diagnosis Date  ? CAD (coronary artery disease) cardiologist-  dr Alan Walker  ? 10-20-2014 cardiac cath--  occluded distal LAD typical appearance of spontaneous dissection with intramural hematoma present, ef 50-55% with inferior-apical AK  ? Dyslipidemia (high LDL; low HDL) 10/20/2014  ? History of exercise stress test   ? ETT 10-11-2015 normal  ? History of ST elevation myocardial infarction (STEMI) 10/20/2014,  EMS shocked pt x1 for VFib  ? inferior wall per cardic cath --  occlusion distal LAD with typical angiographic appearance of spontaneous coronary arter dissection,  medical  therapy recommended  ? Hypertension   ? Palpitations   ? event monitor 12-03-2017 rare PVCs, PACs with short SVT runs,  per dr Helaina Stefano result note, benign  ? Peyronie's disease   ? Wears glasses   ? ? ?Past Surgical History:  ?Procedure Laterality Date  ? LEFT HEART CATHETERIZATION WITH CORONARY ANGIOGRAM N/A 10/20/2014  ? Procedure: LEFT HEART CATHETERIZATION WITH CORONARY ANGIOGRAM;  Surgeon: Alan Ohara, MD; L main OK, LAD 100% distal (dissection), CFX 0%, RCA 0%, EF 50-55%   ? NESBIT PROCEDURE N/A 10/15/2018  ? Procedure: NESBIT PROCEDURE;  Surgeon: Alan Frock, MD;  Location: [facility];  Service: Urology;  Laterality: N/A;  ? NO PAST SURGERIES    ? ? ?Current Medications: ?Current Meds  ?Medication Sig  ? amLODipine (NORVASC) 5 MG tablet TAKE 1 TABLET(5 MG) BY MOUTH DAILY  ? aspirin EC 81 MG tablet Take 81 mg by mouth daily.  ? atorvastatin (LIPITOR) 20 MG tablet TAKE 1 TABLET BY MOUTH EVERY DAY AT 6 PM  ? carvedilol (COREG) 12.5 MG tablet Take 1 tablet (12.5 mg total) by mouth 2 (two) times daily with a meal.  ? nitroGLYCERIN (NITROSTAT) 0.4 MG SL tablet Place 1 tablet (0.4 mg total) under the tongue every 5 (five) minutes as needed for chest pain.  ?  ? ?Allergies:   Patient has no known allergies.  ? ?Social History  ? ?Socioeconomic History  ? Marital status: Married  ?  Spouse name: Not  on file  ? Number of children: Not on file  ? Years of education: Not on file  ? Highest education level: Not on file  ?Occupational History  ? Not on file  ?Tobacco Use  ? Smoking status: Never  ? Smokeless tobacco: Never  ?Vaping Use  ? Vaping Use: Never used  ?Substance and Sexual Activity  ? Alcohol use: No  ? Drug use: Never  ? Sexual activity: Yes  ?Other Topics Concern  ? Not on file  ?Social History Narrative  ? Not on file  ? ?Social Determinants of Health  ? ?Financial Resource Strain: Not on file  ?Food Insecurity: Not on file  ?Transportation Needs: Not on file  ?Physical Activity: Not on  file  ?Stress: Not on file  ?Social Connections: Not on file  ?  ? ?Family History: ?The patient's family history includes Asthma in his mother and son; Hypertension in his mother; Stroke in his brother. There is no history of Heart attack. ? ?ROS:   ?Please see the history of present illness.    ?All other systems reviewed and are negative. ? ?EKGs/Labs/Other Studies Reviewed:   ? ?The following studies were reviewed today: ?2D Echo 10/22/2014: ?Study Conclusions  ? ?- Left ventricle: The cavity size was normal. Wall thickness was  ?  normal. Systolic function was normal. The estimated ejection  ?  fraction was in the range of 55% to 60%. Although no diagnostic  ?  regional wall motion abnormality was identified, this possibility  ?  cannot be completely excluded on the basis of this study. Doppler  ?  parameters are consistent with abnormal left ventricular  ?  relaxation (grade 1 diastolic dysfunction).  ?- Aortic valve: There was trivial regurgitation. Valve area (Vmax):  ?  3.25 cm^2.  ? ?EKG:  EKG is ordered today.  The ekg ordered today shows normal sinus rhythm 73 bpm, age-indeterminate inferior infarct, occasional PVC, no significant change from previous tracing. ? ?Recent Labs: ?No results found for requested labs within last 8760 hours.  ?Recent Lipid Panel ?   ?Component Value Date/Time  ? CHOL 191 06/27/2020 1226  ? TRIG 92 06/27/2020 1226  ? HDL 53 06/27/2020 1226  ? CHOLHDL 3.6 06/27/2020 1226  ? CHOLHDL 3 11/24/2014 0835  ? VLDL 16.6 11/24/2014 0835  ? LDLCALC 121 (H) 06/27/2020 1226  ? ? ? ?Risk Assessment/Calculations:   ?  ? ?    ? ?Physical Exam:   ? ?VS:  BP 116/84   Pulse 73   Ht '5\' 10"'$  (1.778 m)   Wt 224 lb 9.6 oz (101.9 kg)   SpO2 95%   BMI 32.23 kg/m?    ? ?Wt Readings from Last 3 Encounters:  ?12/17/21 224 lb 9.6 oz (101.9 kg)  ?06/27/20 222 lb 3.2 oz (100.8 kg)  ?06/27/19 207 lb 12.8 oz (94.3 kg)  ?  ? ?GEN:  Well nourished, well developed in no acute distress ?HEENT: Normal ?NECK: No  JVD; No carotid bruits ?LYMPHATICS: No lymphadenopathy ?CARDIAC: RRR, no murmurs, rubs, gallops ?RESPIRATORY:  Clear to auscultation without rales, wheezing or rhonchi  ?ABDOMEN: Soft, non-tender, non-distended ?MUSCULOSKELETAL:  No edema; No deformity  ?SKIN: Warm and dry ?NEUROLOGIC:  Alert and oriented x 3 ?PSYCHIATRIC:  Normal affect  ? ?ASSESSMENT:   ? ?1. Coronary artery disease involving native coronary artery of native heart without angina pectoris   ?2. Essential hypertension   ?3. Mixed hyperlipidemia   ?4. Encounter for screening colonoscopy   ? ?  PLAN:   ? ?In order of problems listed above: ? ?Patient appears stable.  He remains on aspirin and a statin drug.  He is having no symptoms of angina. ?Blood pressure is controlled on amlodipine and carvedilol. ?Treated with atorvastatin 20 mg daily.  Lipids were last checked in 2021 and his LDL was 121 mg/dL at that point.  He needs updated labs.  I will order a CBC, metabolic panel, and lipid panel for fasting lab work.  I will refer him to gastroenterology for screening colonoscopy.  We will give him contact information to find a primary care physician.  ? ?   ? ?   ? ? ?Medication Adjustments/Labs and Tests Ordered: ?Current medicines are reviewed at length with the patient today.  Concerns regarding medicines are outlined above.  ?Orders Placed This Encounter  ?Procedures  ? CBC  ? Comprehensive metabolic panel  ? Lipid panel  ? Ambulatory referral to Gastroenterology  ? EKG 12-Lead  ? ?No orders of the defined types were placed in this encounter. ? ? ?Patient Instructions  ?Medication Instructions:  ?Your physician recommends that you continue on your current medications as directed. Please refer to the Current Medication list given to you today. ? ?*If you need a refill on your cardiac medications before your next appointment, please call your pharmacy* ? ? ?Lab Work: ?CBC, CMET, LIPIDS ?If you have labs (blood work) drawn today and your tests are  completely normal, you will receive your results only by: ?MyChart Message (if you have MyChart) OR ?A paper copy in the mail ?If you have any lab test that is abnormal or we need to change your treatment, we wil

## 2021-12-20 ENCOUNTER — Other Ambulatory Visit: Payer: BC Managed Care – PPO | Admitting: *Deleted

## 2021-12-20 DIAGNOSIS — I2510 Atherosclerotic heart disease of native coronary artery without angina pectoris: Secondary | ICD-10-CM

## 2021-12-20 DIAGNOSIS — I10 Essential (primary) hypertension: Secondary | ICD-10-CM

## 2021-12-20 DIAGNOSIS — E782 Mixed hyperlipidemia: Secondary | ICD-10-CM

## 2021-12-20 LAB — CBC
Hematocrit: 37.3 % — ABNORMAL LOW (ref 37.5–51.0)
Hemoglobin: 12.6 g/dL — ABNORMAL LOW (ref 13.0–17.7)
MCH: 31.1 pg (ref 26.6–33.0)
MCHC: 33.8 g/dL (ref 31.5–35.7)
MCV: 92 fL (ref 79–97)
Platelets: 258 10*3/uL (ref 150–450)
RBC: 4.05 x10E6/uL — ABNORMAL LOW (ref 4.14–5.80)
RDW: 11.1 % — ABNORMAL LOW (ref 11.6–15.4)
WBC: 4.9 10*3/uL (ref 3.4–10.8)

## 2021-12-20 LAB — COMPREHENSIVE METABOLIC PANEL
ALT: 23 IU/L (ref 0–44)
AST: 20 IU/L (ref 0–40)
Albumin/Globulin Ratio: 1.3 (ref 1.2–2.2)
Albumin: 4.3 g/dL (ref 3.8–4.9)
Alkaline Phosphatase: 53 IU/L (ref 44–121)
BUN/Creatinine Ratio: 13 (ref 10–24)
BUN: 13 mg/dL (ref 8–27)
Bilirubin Total: 0.5 mg/dL (ref 0.0–1.2)
CO2: 25 mmol/L (ref 20–29)
Calcium: 9 mg/dL (ref 8.6–10.2)
Chloride: 106 mmol/L (ref 96–106)
Creatinine, Ser: 1.04 mg/dL (ref 0.76–1.27)
Globulin, Total: 3.2 g/dL (ref 1.5–4.5)
Glucose: 92 mg/dL (ref 70–99)
Potassium: 4.2 mmol/L (ref 3.5–5.2)
Sodium: 142 mmol/L (ref 134–144)
Total Protein: 7.5 g/dL (ref 6.0–8.5)
eGFR: 82 mL/min/{1.73_m2} (ref >59)

## 2021-12-20 LAB — LIPID PANEL
Chol/HDL Ratio: 3.5 ratio (ref 0.0–5.0)
Cholesterol, Total: 165 mg/dL (ref 100–199)
HDL: 47 mg/dL (ref >39)
LDL Chol Calc (NIH): 108 mg/dL — ABNORMAL HIGH (ref 0–99)
Triglycerides: 51 mg/dL (ref 0–149)
VLDL Cholesterol Cal: 10 mg/dL (ref 5–40)

## 2021-12-23 ENCOUNTER — Telehealth: Payer: Self-pay

## 2021-12-23 DIAGNOSIS — Z79899 Other long term (current) drug therapy: Secondary | ICD-10-CM

## 2021-12-23 DIAGNOSIS — E782 Mixed hyperlipidemia: Secondary | ICD-10-CM

## 2021-12-23 MED ORDER — ATORVASTATIN CALCIUM 40 MG PO TABS: 40.0000 mg | ORAL_TABLET | Freq: Every day | ORAL | 3 refills | Status: DC

## 2021-12-23 MED ORDER — EZETIMIBE 10 MG PO TABS: 10.0000 mg | ORAL_TABLET | Freq: Every day | ORAL | 3 refills | Status: DC

## 2021-12-23 NOTE — Telephone Encounter (Signed)
Lab results and recommendations provided to patient. Medications sent to pharmacy on file and lab appt scheduled for repeat lipids. ?

## 2021-12-23 NOTE — Telephone Encounter (Signed)
-----   Message from Sherren Mocha, MD sent at 12/22/2021  8:35 PM EDT ----- ?Labs reviewed. Lipids not at goal. Other labs in stable/good range. Recommend increase atorvastatin to 40 mg daily and add zetia 10 mg daily to intensify lipid lowering therapy. Repeat lipids and LFT's in 3-4 months. thanks ?

## 2021-12-24 ENCOUNTER — Ambulatory Visit (AMBULATORY_SURGERY_CENTER): Payer: BC Managed Care – PPO | Admitting: *Deleted

## 2021-12-24 VITALS — Ht 70.0 in | Wt 224.0 lb

## 2021-12-24 DIAGNOSIS — Z1211 Encounter for screening for malignant neoplasm of colon: Secondary | ICD-10-CM

## 2021-12-24 MED ORDER — NA SULFATE-K SULFATE-MG SULF 17.5-3.13-1.6 GM/177ML PO SOLN: 1.0000 | Freq: Once | ORAL | 0 refills | Status: AC

## 2021-12-24 NOTE — Progress Notes (Signed)
No egg or soy allergy known to patient  ?No issues known to pt with past sedation with any surgeries or procedures ?Patient denies ever being told they had issues or difficulty with intubation  ?No FH of Malignant Hyperthermia ?Pt is not on diet pills ?Pt is not on  home 02  ?Pt is not on blood thinners  ?Pt denies issues with constipation  ?No A fib or A flutter ? ?NO PA's for preps discussed with pt In PV today  ?Discussed with pt there will be an out-of-pocket cost for prep and that varies from $0 to 70 +  dollars - pt verbalized understanding  ? ?Due to the COVID-19 pandemic we are asking patients to follow certain guidelines in PV and the [place]   ?Pt aware of COVID protocols and [facility] guidelines  ? ?PV completed over the phone. Pt verified name, DOB, address and insurance during PV today.  ?Pt mailed instruction packet with copy of consent form to read and not return, and instructions.  ?Pt encouraged to call with questions or issues.  ?If pt has My chart, procedure instructions sent via My Chart  ? ?

## 2022-01-06 ENCOUNTER — Other Ambulatory Visit: Payer: Self-pay | Admitting: Cardiovascular Disease

## 2022-01-07 ENCOUNTER — Encounter: Payer: Self-pay | Admitting: Gastroenterology

## 2022-01-15 ENCOUNTER — Encounter: Payer: Self-pay | Admitting: Gastroenterology

## 2022-01-15 ENCOUNTER — Ambulatory Visit (AMBULATORY_SURGERY_CENTER): Payer: BC Managed Care – PPO | Admitting: Gastroenterology

## 2022-01-15 VITALS — BP 108/69 | HR 67 | Temp 98.3°F | Resp 13 | Ht 70.0 in | Wt 224.0 lb

## 2022-01-15 DIAGNOSIS — D125 Benign neoplasm of sigmoid colon: Secondary | ICD-10-CM

## 2022-01-15 DIAGNOSIS — K621 Rectal polyp: Secondary | ICD-10-CM | POA: Diagnosis not present

## 2022-01-15 DIAGNOSIS — K635 Polyp of colon: Secondary | ICD-10-CM | POA: Diagnosis not present

## 2022-01-15 DIAGNOSIS — D128 Benign neoplasm of rectum: Secondary | ICD-10-CM

## 2022-01-15 DIAGNOSIS — D122 Benign neoplasm of ascending colon: Secondary | ICD-10-CM

## 2022-01-15 DIAGNOSIS — Z1211 Encounter for screening for malignant neoplasm of colon: Secondary | ICD-10-CM | POA: Diagnosis not present

## 2022-01-15 MED ORDER — SODIUM CHLORIDE 0.9 % IV SOLN: 500.0000 mL | Freq: Once | INTRAVENOUS | Status: DC

## 2022-01-15 NOTE — Progress Notes (Signed)
Called to room to assist during endoscopic procedure.  Patient ID and intended procedure confirmed with present staff. Received instructions for my participation in the procedure from the performing physician.  

## 2022-01-15 NOTE — Patient Instructions (Signed)
Handout on polyps, diverticulosis, and hemorrhoids provided  ? ?Await pathology results.  ? ?Continue current medications. YOU HAD AN ENDOSCOPIC PROCEDURE TODAY AT [facility]:   Refer to the procedure report that was given to you for any specific questions about what was found during the examination.  If the procedure report does not answer your questions, please call your gastroenterologist to clarify.  If you requested that your care partner not be given the details of your procedure findings, then the procedure report has been included in a sealed envelope for you to review at your convenience later. ? ?YOU SHOULD EXPECT: Some feelings of bloating in the abdomen. Passage of more gas than usual.  Walking can help get rid of the air that was put into your GI tract during the procedure and reduce the bloating. If you had a lower endoscopy (such as a colonoscopy or flexible sigmoidoscopy) you may notice spotting of blood in your stool or on the toilet paper. If you underwent a bowel prep for your procedure, you may not have a normal bowel movement for a few days. ? ?Please Note:  You might notice some irritation and congestion in your nose or some drainage.  This is from the oxygen used during your procedure.  There is no need for concern and it should clear up in a day or so. ? ?SYMPTOMS TO REPORT IMMEDIATELY: ? ?Following lower endoscopy (colonoscopy or flexible sigmoidoscopy): ? Excessive amounts of blood in the stool ? Significant tenderness or worsening of abdominal pains ? Swelling of the abdomen that is new, acute ? Fever of 100?F or higher ? ? ?For urgent or emergent issues, a gastroenterologist can be reached at any hour by calling [phone]. ?Do not use MyChart messaging for urgent concerns.  ? ? ?DIET:  We do recommend a small meal at first, but then you may proceed to your regular diet.  Drink plenty of fluids but you should avoid alcoholic beverages for 24 hours. ? ?ACTIVITY:  You  should plan to take it easy for the rest of today and you should NOT DRIVE or use heavy machinery until tomorrow (because of the sedation medicines used during the test).   ? ?FOLLOW UP: ?Our staff will call the number listed on your records 48-72 hours following your procedure to check on you and address any questions or concerns that you may have regarding the information given to you following your procedure. If we do not reach you, we will leave a message.  We will attempt to reach you two times.  During this call, we will ask if you have developed any symptoms of COVID 19. If you develop any symptoms (ie: fever, flu-like symptoms, shortness of breath, cough etc.) before then, please call [phone].  If you test positive for Covid 19 in the 2 weeks post procedure, please call and report this information to [place].   ? ?If any biopsies were taken you will be contacted by phone or by letter within the next 1-3 weeks.  Please call us at [phone] if you have not heard about the biopsies in 3 weeks.  ? ? ?SIGNATURES/CONFIDENTIALITY: ?You and/or your care partner have signed paperwork which will be entered into your electronic medical record.  These signatures attest to the fact that that the information above on your After Visit Summary has been reviewed and is understood.  Full responsibility of the confidentiality of this discharge information lies with you and/or your care-partner. ? ?

## 2022-01-15 NOTE — Progress Notes (Signed)
Pt's states no medical or surgical changes since previsit or office visit. 

## 2022-01-15 NOTE — Progress Notes (Signed)
[facility] History and Physical ? ? ?Primary Care Physician:  Patient, No Pcp Per (Inactive) ? ? ?Reason for Procedure:   Colon cancer screening ? ?Plan:    colonoscopy ? ? ? ? ?HPI: Alan Walker is a 61 y.o. male  here for colonoscopy screening - first time exam. Patient denies any bowel symptoms at this time. No family history of colon cancer known. Otherwise feels well without any cardiopulmonary symptoms.  ? ? ?Past Medical History:  ?Diagnosis Date  ? CAD (coronary artery disease) cardiologist-  dr cooper  ? 10-20-2014 cardiac cath--  occluded distal LAD typical appearance of spontaneous dissection with intramural hematoma present, ef 50-55% with inferior-apical AK  ? Dyslipidemia (high LDL; low HDL) 10/20/2014  ? History of exercise stress test   ? ETT 10-11-2015 normal  ? History of ST elevation myocardial infarction (STEMI) 10/20/2014,  EMS shocked pt x1 for VFib  ? inferior wall per cardic cath --  occlusion distal LAD with typical angiographic appearance of spontaneous coronary arter dissection,  medical therapy recommended  ? Hypertension   ? Myocardial infarction [facility]) 2016  ? STEMI  ? Palpitations   ? event monitor 12-03-2017 rare PVCs, PACs with short SVT runs,  per dr cooper result note, benign  ? Peyronie's disease   ? Wears glasses   ? ? ?Past Surgical History:  ?Procedure Laterality Date  ? LEFT HEART CATHETERIZATION WITH CORONARY ANGIOGRAM N/A 10/20/2014  ? Procedure: LEFT HEART CATHETERIZATION WITH CORONARY ANGIOGRAM;  Surgeon: Blane Ohara, MD; L main OK, LAD 100% distal (dissection), CFX 0%, RCA 0%, EF 50-55%   ? NESBIT PROCEDURE N/A 10/15/2018  ? Procedure: NESBIT PROCEDURE;  Surgeon: Alexis Frock, MD;  Location: [facility];  Service: Urology;  Laterality: N/A;  ? ? ?Prior to Admission medications   ?Medication Sig Start Date End Date Taking? Authorizing Provider  ?amLODipine (NORVASC) 5 MG tablet TAKE 1 TABLET(5 MG) BY MOUTH DAILY 11/19/21  Yes Sherren Mocha, MD  ?aspirin EC 81 MG tablet Take 81 mg by mouth daily.   Yes [provider]  ?atorvastatin (LIPITOR) 40 MG tablet Take 1 tablet (40 mg total) by mouth daily. 12/23/21  Yes Sherren Mocha, MD  ?carvedilol (COREG) 12.5 MG tablet TAKE 1 TABLET(12.5 MG) BY MOUTH TWICE DAILY WITH A MEAL 01/07/22  Yes Sherren Mocha, MD  ?ezetimibe (ZETIA) 10 MG tablet Take 1 tablet (10 mg total) by mouth daily. 12/23/21  Yes Sherren Mocha, MD  ?nitroGLYCERIN (NITROSTAT) 0.4 MG SL tablet Place 1 tablet (0.4 mg total) under the tongue every 5 (five) minutes as needed for chest pain. 10/23/14   Barrett, Evelene Croon, PA-C  ? ? ?Current Outpatient Medications  ?Medication Sig Dispense Refill  ? amLODipine (NORVASC) 5 MG tablet TAKE 1 TABLET(5 MG) BY MOUTH DAILY 90 tablet 0  ? aspirin EC 81 MG tablet Take 81 mg by mouth daily.    ? atorvastatin (LIPITOR) 40 MG tablet Take 1 tablet (40 mg total) by mouth daily. 90 tablet 3  ? carvedilol (COREG) 12.5 MG tablet TAKE 1 TABLET(12.5 MG) BY MOUTH TWICE DAILY WITH A MEAL 180 tablet 3  ? ezetimibe (ZETIA) 10 MG tablet Take 1 tablet (10 mg total) by mouth daily. 90 tablet 3  ? nitroGLYCERIN (NITROSTAT) 0.4 MG SL tablet Place 1 tablet (0.4 mg total) under the tongue every 5 (five) minutes as needed for chest pain. 25 tablet 3  ? ?Current Facility-Administered Medications  ?Medication Dose Route Frequency Provider Last  Rate Last Admin  ? 0.9 %  sodium chloride infusion  500 mL Intravenous Once Jaylee Lantry, Carlota Raspberry, MD      ? ? ?Allergies as of 01/15/2022  ? (No Known Allergies)  ? ? ?Family History  ?Problem Relation Age of Onset  ? Hypertension Mother   ? Asthma Mother   ? Stroke Brother   ? Asthma Son   ? Heart attack Neg Hx   ? Colon cancer Neg Hx   ? Colon polyps Neg Hx   ? Esophageal cancer Neg Hx   ? Rectal cancer Neg Hx   ? Stomach cancer Neg Hx   ? ? ?Social History  ? ?Socioeconomic History  ? Marital status: Married  ?  Spouse name: Not on file  ? Number of children: Not on file  ?  Years of education: Not on file  ? Highest education level: Not on file  ?Occupational History  ? Not on file  ?Tobacco Use  ? Smoking status: Never  ? Smokeless tobacco: Never  ?Vaping Use  ? Vaping Use: Never used  ?Substance and Sexual Activity  ? Alcohol use: No  ? Drug use: Never  ? Sexual activity: Yes  ?Other Topics Concern  ? Not on file  ?Social History Narrative  ? Not on file  ? ?Social Determinants of Health  ? ?Financial Resource Strain: Not on file  ?Food Insecurity: Not on file  ?Transportation Needs: Not on file  ?Physical Activity: Not on file  ?Stress: Not on file  ?Social Connections: Not on file  ?Intimate Partner Violence: Not on file  ? ? ?Review of Systems: ?All other review of systems negative except as mentioned in the HPI. ? ?Physical Exam: ?Vital signs ?BP (!) 148/96   Pulse 69   Temp 98.3 ?F (36.8 ?C)   Ht '5\' 10"'$  (1.778 m)   Wt 224 lb (101.6 kg)   SpO2 96%   BMI 32.14 kg/m?  ? ?General:   Alert,  Well-developed, pleasant and cooperative in NAD ?Lungs:  Clear throughout to auscultation.   ?Heart:  Regular rate and rhythm ?Abdomen:  Soft, nontender and nondistended.   ?Neuro/Psych:  Alert and cooperative. Normal mood and affect. A and O x 3 ? ?Jolly Mango, MD ?[facility] ? ? ?

## 2022-01-15 NOTE — Progress Notes (Signed)
Report to PACU, RN, vss, BBS= Clear.  

## 2022-01-15 NOTE — Op Note (Signed)
[facility] ?Patient Name: Alan Walker ?Procedure Date: 01/15/2022 9:50 AM ?MRN: [id number] ?Endoscopist: Carlota Raspberry. Havery Moros , MD ?Age: 61 ?Referring MD:  ?Date of Birth: 17-Jan-1961 ?Gender: Male ?Account #: [id number] ?Procedure:                Colonoscopy ?Indications:              Screening for colorectal malignant neoplasm, This  ?                          is the patient's first colonoscopy ?Medicines:                Monitored Anesthesia Care ?Procedure:                Pre-Anesthesia Assessment: ?                          - Prior to the procedure, a History and Physical  ?                          was performed, and patient medications and  ?                          allergies were reviewed. The patient's tolerance of  ?                          previous anesthesia was also reviewed. The risks  ?                          and benefits of the procedure and the sedation  ?                          options and risks were discussed with the patient.  ?                          All questions were answered, and informed consent  ?                          was obtained. Prior Anticoagulants: The patient has  ?                          taken no previous anticoagulant or antiplatelet  ?                          agents. ASA Grade Assessment: II - A patient with  ?                          mild systemic disease. After reviewing the risks  ?                          and benefits, the patient was deemed in  ?                          satisfactory condition to undergo the procedure. ?  After obtaining informed consent, the colonoscope  ?                          was passed under direct vision. Throughout the  ?                          procedure, the patient's blood pressure, pulse, and  ?                          oxygen saturations were monitored continuously. The  ?                          Olympus [id number] was introduced through the anus  ?                          and advanced to the  the cecum, identified by  ?                          appendiceal orifice and ileocecal valve. The  ?                          colonoscopy was performed without difficulty. The  ?                          patient tolerated the procedure well. The quality  ?                          of the bowel preparation was good. The ileocecal  ?                          valve, appendiceal orifice, and rectum were  ?                          photographed. ?Scope In: 9:55:37 AM ?Scope Out: 10:18:46 AM ?Scope Withdrawal Time: 0 hours 19 minutes 56 seconds  ?Total Procedure Duration: 0 hours 23 minutes 9 seconds  ?Findings:                 The perianal and digital rectal examinations were  ?                          normal. ?                          A 3 mm polyp was found in the ascending colon. The  ?                          polyp was sessile. The polyp was removed with a  ?                          cold snare. Resection and retrieval were complete. ?                          A 3 mm polyp was found in the sigmoid colon. The  ?  polyp was sessile. The polyp was removed with a  ?                          cold snare. Resection and retrieval were complete. ?                          A 3 mm polyp was found in the rectum. The polyp was  ?                          sessile. The polyp was removed with a cold snare.  ?                          Resection and retrieval were complete. ?                          A few small-mouthed diverticula were found in the  ?                          sigmoid colon. ?                          Internal hemorrhoids were found during  ?                          retroflexion. The hemorrhoids were small. ?                          The exam was otherwise without abnormality. ?Complications:            No immediate complications. Estimated blood loss:  ?                          Minimal. ?Estimated Blood Loss:     Estimated blood loss was minimal. ?Impression:               - One 3 mm polyp  in the ascending colon, removed  ?                          with a cold snare. Resected and retrieved. ?                          - One 3 mm polyp in the sigmoid colon, removed with  ?                          a cold snare. Resected and retrieved. ?                          - One 3 mm polyp in the rectum, removed with a cold  ?                          snare. Resected and retrieved. ?                          - Diverticulosis in the sigmoid colon. ?                          -  Internal hemorrhoids. ?                          - The examination was otherwise normal. ?Recommendation:           - Patient has a contact number available for  ?                          emergencies. The signs and symptoms of potential  ?                          delayed complications were discussed with the  ?                          patient. Return to normal activities tomorrow.  ?                          Written discharge instructions were provided to the  ?                          patient. ?                          - Resume previous diet. ?                          - Continue present medications. ?                          - Await pathology results. ?Carlota Raspberry. Havery Moros, MD ?01/15/2022 10:22:39 AM ?This report has been signed electronically. ?

## 2022-01-17 ENCOUNTER — Telehealth: Payer: Self-pay | Admitting: *Deleted

## 2022-01-17 NOTE — Telephone Encounter (Signed)
?  Follow up Call- ? ? ?  01/15/2022  ?  9:07 AM  ?Call back number  ?Post procedure Call Back phone  # [phone]  ?Permission to leave phone message Yes  ?  ? ?Patient questions: ? ?Do you have a fever, pain , or abdominal swelling? No. ?Pain Score  0 * ? ?Have you tolerated food without any problems? Yes.   ? ?Have you been able to return to your normal activities? Yes.   ? ?Do you have any questions about your discharge instructions: ?Diet   No. ?Medications  No. ?Follow up visit  No. ? ?Do you have questions or concerns about your Care? No. ? ?Actions: ?* If pain score is 4 or above: ?No action needed, pain <4. ? ? ?

## 2022-01-20 ENCOUNTER — Emergency Department (HOSPITAL_COMMUNITY): Payer: Medicaid Other

## 2022-01-20 ENCOUNTER — Other Ambulatory Visit: Payer: Self-pay

## 2022-01-20 ENCOUNTER — Encounter (HOSPITAL_COMMUNITY): Payer: Self-pay

## 2022-01-20 ENCOUNTER — Emergency Department (HOSPITAL_COMMUNITY): Payer: Medicaid Other | Admitting: Radiology

## 2022-01-20 ENCOUNTER — Emergency Department
Admission: EM | Admit: 2022-01-20 | Discharge: 2022-01-20 | Disposition: A | Discharge status: Home / Self Care | Payer: Medicaid Other | Attending: Emergency Medicine | Admitting: Emergency Medicine

## 2022-01-20 DIAGNOSIS — Y9389 Activity, other specified: Secondary | ICD-10-CM | POA: Insufficient documentation

## 2022-01-20 DIAGNOSIS — S52502A Unspecified fracture of the lower end of left radius, initial encounter for closed fracture: Secondary | ICD-10-CM | POA: Insufficient documentation

## 2022-01-20 DIAGNOSIS — W228XXA Striking against or struck by other objects, initial encounter: Secondary | ICD-10-CM | POA: Insufficient documentation

## 2022-01-20 DIAGNOSIS — S52509A Unspecified fracture of the lower end of unspecified radius, initial encounter for closed fracture: Secondary | ICD-10-CM

## 2022-01-20 NOTE — ED Nurses Note (Addendum)
Splint placed to right wrist per provider order, educated on splint. Patient discharged home.  AVS reviewed with patient.  A written copy of the AVS and discharge instructions was given to the patient.  Questions sufficiently answered as needed.  Patient encouraged to follow up with ortho as indicated.  In the event of an emergency, patient  instructed to call 911 or go to the nearest emergency room.  Patient ambulated from department at discharge without difficulty.

## 2022-01-20 NOTE — ED Triage Notes (Addendum)
Patient is complaining of left hand/wrist pain post loading brush.     Patient smells of ETOH
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
## 2022-01-22 ENCOUNTER — Encounter (INDEPENDENT_AMBULATORY_CARE_PROVIDER_SITE_OTHER): Payer: Self-pay | Admitting: Physician Assistant

## 2022-01-22 ENCOUNTER — Other Ambulatory Visit: Payer: Self-pay

## 2022-01-22 ENCOUNTER — Ambulatory Visit: Payer: Medicaid Other | Attending: Physician Assistant | Admitting: Physician Assistant

## 2022-01-22 VITALS — BP 142/98 | Ht 70.0 in | Wt 180.0 lb

## 2022-01-22 DIAGNOSIS — S52552A Other extraarticular fracture of lower end of left radius, initial encounter for closed fracture: Secondary | ICD-10-CM

## 2022-01-22 DIAGNOSIS — W208XXA Other cause of strike by thrown, projected or falling object, initial encounter: Secondary | ICD-10-CM | POA: Insufficient documentation

## 2022-01-22 MED ORDER — TRAMADOL 50 MG TABLET
1.00 | ORAL_TABLET | Freq: Four times a day (QID) | ORAL | 0 refills | Status: DC | PRN
Start: 2022-01-22 — End: 2022-03-04

## 2022-01-22 NOTE — Addendum Note (Signed)
Addended by: Dickey Gave on: 01/22/2022 01:50 PM     Modules accepted: Orders

## 2022-01-22 NOTE — Progress Notes (Signed)
[facility]  [address]  [place] [address]  Dept: [phone]  Dept Fax: [phone]        NEW PATIENT OFFICE VISIT      PATIENT NAME:  Thomas Esparza  MEDICAL RECORD NUMBER: [id number]    DICTATING PHYSICIAN:  Hughes Better, PA-C  REFERRING PHYSICIAN:  Renne Crigler, MD    DOB:  Feb 21, 1961  DOS:  01/22/2022      CHIEF COMPLAINT:   Chief Complaint   Patient presents with    Wrist Fracture     LEFT WRIST DOI: 01/18/22        HISTORY OF PRESENT ILLNESS:    Thomas Esparza is a 61 y.o. male who presents to the office today for initial consultation in regards to his left wrist.  He states that he was loading brush was struck on the left wrist by a branch on 01/18/2022.  He presented for x-rays and was found have a distal radius fracture.  He was placed in a volar splint referred to Orthopedics for further management.  Patient states his pain has been controlled.  He has no other complaints or concerns today.  He denies numbness and tingling.      PAST MEDICAL HISTORY:  History reviewed. No pertinent past medical history.        PAST SURGICAL HISTORY:  No past surgical history is listed.      FAMILY HISTORY:  Family Medical History:    None           SOCIAL HISTORY:  Social History     Socioeconomic History    Marital status: Single     Spouse name: Not on file    Number of children: Not on file    Years of education: Not on file    Highest education level: Not on file   Occupational History    Not on file   Tobacco Use    Smoking status: Every Day    Smokeless tobacco: Current     Types: Chew   Substance and Sexual Activity    Alcohol use: Yes     Alcohol/week: 4.0 standard drinks     Types: 4 Cans of beer per week    Drug use: Never    Sexual activity: Not on file   Other Topics Concern    Not on file   Social History Narrative    Not on file     Social Determinants of Health     Financial Resource Strain: Not on file   Transportation Needs: Not on file    Social Connections: Not on file   Intimate Partner Violence: Not on file   Housing Stability: Not on file       ALLERGIES:  No Known Allergies    MEDICATIONS:  No current outpatient medications on file.     No current facility-administered medications for this visit.         REVIEW OF SYMPTOMS:  Patient denies fever, chills, chest pain or shortness of breath. All other review of systems reviewed and were negative.        PHYSICAL EXAM:    Vitals:    01/22/22 1329   BP: (!) 142/98   Weight: 81.6 kg (180 lb)   Height: 1.778 m (5\' 10" )    Body mass index is 25.83 kg/m.    General: Healthy and cooperative,  no acute distress  Psychiatric: Normal mood and affect, Alert, oriented  to time, person, place   Lungs:  Respirations symmetric and non-labored  Cardiac: Regular rate and rhythm     Skin:   No scars, lesions or rashes or erythema  Lymphatic: No lymphadenopathy  Neurologic: Dermatomal sensation intact    Musculoskeletal:    Left wrist:  He has minimal swelling. He is tender along the distal radius.  He has no pain on passive or active range of motion of the digits.  He is sensory intact to light touch with brisk capillary refill.    IMAGING:     [facility] D [facility]     PROCEDURE DESCRIPTION: XR WRIST LEFT     TECHNIQUE: 3 views / 3 images submitted.     CLINICAL INDICATION: Struck by a branch     COMPARISON: No prior studies were compared.        FINDINGS: 3 views left wrist show nondisplaced fracture at the distal radial metaphysis with overlying soft tissue swelling. Alignment is maintained.     IMPRESSION:  Distal radius fracture.      ASSESSMENT:    Assessment/Plan   1. Other closed extra-articular fracture of distal end of left radius, initial encounter         PLAN:    Today, I would a lengthy discussion with the patient regarding his left wrist fracture.  We discussed the natural course of distal radius fractures.  As of now, his fracture is amendable to non operative management.  We did discuss the risk of  fracture displacement which could potentially require surgery.  Today, he was placed in a properly padded, well-molded, short-arm fiberglass cast and educated on cast care.  Will follow up in 1 week for cast check and repeat x-rays to monitor his fracture alignment.  Patient understands he will likely be in a cast for a total of 6 weeks then be converted to a brace as long as his fracture is consolidated enough at that point.  He was instructed to contact the office with any additional concerns prior to his next appointment.    Opioid Prescription - First Prescription  Diagnosis requiring prescription: Left wrist fracture     Medication Dosage/Frequency being prescribed: Tramadol 50 mg, 1 tablet PO every 6 hours as needed for pain, 0 refills    I have reviewed prior medication history in the medical record for this patient.  I have also reviewed information contained in the state controlled prescription drug monitoring database.    I have discussed any history of non-pharmacological treatment with the patient.  The patient reports no prior treatment history.      The patient denies history of substance abuse treatment.      Physical exam findings and/or clinical history warranting use of opioid treatment include: left wrist pain and swelling, fracture    My goals for treatment include: pain relief    I have discussed the risk of opioid addiction with the patient.  I have also discussed the risk of using sedatives and alcohol while taking opioids.      Patient seemed pleased and agreeable to this plan. All questions answered to the patient's satisfaction.    This note was partially generated using MModal Fluency Direct system, and there may be some incorrect words, spellings, and punctuation that were not noted in checking the note before saving.      Hughes Better, PA-C 01/22/2022, 13:35

## 2022-01-24 ENCOUNTER — Other Ambulatory Visit (INDEPENDENT_AMBULATORY_CARE_PROVIDER_SITE_OTHER): Payer: Self-pay | Admitting: Physician Assistant

## 2022-01-24 DIAGNOSIS — S52552A Other extraarticular fracture of lower end of left radius, initial encounter for closed fracture: Secondary | ICD-10-CM

## 2022-01-29 ENCOUNTER — Encounter (INDEPENDENT_AMBULATORY_CARE_PROVIDER_SITE_OTHER): Payer: Self-pay | Admitting: Physician Assistant

## 2022-01-29 ENCOUNTER — Ambulatory Visit (HOSPITAL_COMMUNITY)
Admission: RE | Admit: 2022-01-29 | Discharge: 2022-01-29 | Disposition: A | Discharge status: Home / Self Care | Payer: Medicaid Other | Source: Ambulatory Visit | Attending: Physician Assistant | Admitting: Physician Assistant

## 2022-01-29 ENCOUNTER — Other Ambulatory Visit: Payer: Self-pay

## 2022-01-29 ENCOUNTER — Ambulatory Visit: Payer: Medicaid Other | Attending: Physician Assistant | Admitting: Physician Assistant

## 2022-01-29 VITALS — Ht 70.0 in | Wt 180.0 lb

## 2022-01-29 DIAGNOSIS — S52552A Other extraarticular fracture of lower end of left radius, initial encounter for closed fracture: Secondary | ICD-10-CM | POA: Insufficient documentation

## 2022-01-29 DIAGNOSIS — S52552D Other extraarticular fracture of lower end of left radius, subsequent encounter for closed fracture with routine healing: Secondary | ICD-10-CM | POA: Insufficient documentation

## 2022-01-29 MED ORDER — TRAMADOL 50 MG TABLET
1.00 | ORAL_TABLET | Freq: Four times a day (QID) | ORAL | 0 refills | Status: DC | PRN
Start: 2022-01-29 — End: 2022-02-18

## 2022-01-29 NOTE — Progress Notes (Signed)
[facility]    [address]  [place] [address]  Dept: [phone]  Dept Fax: [phone]        RETURN PATIENT OFFICE VISIT      PATIENT NAME:  Thomas Esparza  MEDICAL RECORD NUMBER: [id number]    DICTATING PHYSICIAN:  Hughes Better, PA-C  REFERRING PHYSICIAN:  Renne Crigler, MD    DOB:  20-Sep-1961  DOS:  01/29/2022      CHIEF COMPLAINT:   Chief Complaint   Patient presents with    Closed Distal Radius Fracture     LEFT WRIST DOI 01/18/2022        HISTORY OF PRESENT ILLNESS:    Thomas Esparza is a 61 y.o. male who presents to the office today for followup of his left distal radius wrist fracture.  He has been in a cast and has been doing well.  Date of injury 01/18/2022.  He does request a refill of pain medication.      PAST MEDICAL HISTORY:  History reviewed. No pertinent past medical history.  No past medical history is listed      PAST SURGICAL HISTORY:  No past surgical history is listed        FAMILY HISTORY:  Family Medical History:    None           SOCIAL HISTORY:  Social History     Socioeconomic History    Marital status: Single     Spouse name: Not on file    Number of children: Not on file    Years of education: Not on file    Highest education level: Not on file   Occupational History    Not on file   Tobacco Use    Smoking status: Every Day    Smokeless tobacco: Current     Types: Chew   Substance and Sexual Activity    Alcohol use: Yes     Alcohol/week: 4.0 standard drinks     Types: 4 Cans of beer per week    Drug use: Never    Sexual activity: Not on file   Other Topics Concern    Not on file   Social History Narrative    Not on file     Social Determinants of Health     Financial Resource Strain: Not on file   Transportation Needs: Not on file   Social Connections: Not on file   Intimate Partner Violence: Not on file   Housing Stability: Not on file       ALLERGIES:  No Known Allergies    MEDICATIONS:  Current Outpatient Medications    Medication Sig Dispense Refill    traMADoL (ULTRAM) 50 mg Oral Tablet Take 1 Tablet (50 mg total) by mouth Every 6 hours as needed for Pain 12 Tablet 0     No current facility-administered medications for this visit.         REVIEW OF SYMPTOMS:  Patient denies fever, chills, chest pain or shortness of breath. All other review of systems reviewed and were negative.        PHYSICAL EXAM:    Vitals:    01/29/22 0937   Weight: 81.6 kg (180 lb)   Height: 1.778 m (5\' 10" )   PainSc:   5   PainLoc: Wrist    Body mass index is 25.83 kg/m.    General: Healthy and cooperative,  no acute distress  Psychiatric: Normal mood and  affect, Alert, oriented to time, person, place   Lungs:  Respirations symmetric and non-labored  Cardiac: Regular rate and rhythm     Skin:   No scars, lesions or rashes or erythema  Lymphatic: No lymphadenopathy  Neurologic: Dermatomal sensation intact    Musculoskeletal:    Left wrist:  His cast is well fitted.  He has no pain on passive or active range of motion of the digits.  He is sensory intact to light touch with brisk capillary refill.      IMAGING:    [place]     PROCEDURE DESCRIPTION: XR WRIST LEFT     TECHNIQUE: 3 views / 3 images submitted.     CLINICAL INDICATION: S52.552A: Other closed extra-articular fracture of distal end of left radius, initial encounter     Comparison wrist radiographs dated 01/20/2022.     IMPRESSION:  Alignment of a distal radial fracture is similar to prior exam. Detailed evaluation for residual fracture lucency limited by dense overlying cast material.    ASSESSMENT:      ICD-10-CM    1. Other closed extra-articular fracture of distal end of left radius with routine healing, subsequent encounter  S52.552D            PLAN:      Today, I would a lengthy discussion with the patient regarding his continued care.  He was instructed to contact the office if he has any cast issues or other concerns.  Otherwise, he will follow up in 2 weeks for another x-ray and  clinical assessment.  Patient understands he will likely need to be immobilized in a cast for a total of 6 weeks.    Opioid Prescription - First Refill  This patient has received a prior course of opioid treatment by myself or another provider for management of left distal radius fracture.      Medication dosage/frequency being refilled:  Tramadol 50 mg, 1 tablet PO every 6 hours PRN pain, #12 tablets, 0 refills.     I have reviewed prior medication history in the medical record for this patient.  I have also reviewed information contained in the state controlled prescription drug monitoring database.    I have discussed the risk of opioid addiction with the patient.  I have also discussed the risk of using sedatives and alcohol while taking opioids.    Patient seemed pleased and agreeable to this plan. All questions answered to the patient's satisfaction.    This note was partially generated using MModal Fluency Direct system, and there may be some incorrect words, spellings, and punctuation that were not noted in checking the note before saving.      Hughes Better, PA-C 01/29/2022, 09:37

## 2022-02-13 ENCOUNTER — Other Ambulatory Visit (INDEPENDENT_AMBULATORY_CARE_PROVIDER_SITE_OTHER): Payer: Self-pay | Admitting: Physician Assistant

## 2022-02-13 DIAGNOSIS — S52552D Other extraarticular fracture of lower end of left radius, subsequent encounter for closed fracture with routine healing: Secondary | ICD-10-CM

## 2022-02-18 ENCOUNTER — Ambulatory Visit: Payer: Medicaid Other | Attending: Physician Assistant | Admitting: Physician Assistant

## 2022-02-18 ENCOUNTER — Other Ambulatory Visit: Payer: Self-pay

## 2022-02-18 ENCOUNTER — Ambulatory Visit (HOSPITAL_COMMUNITY)
Admission: RE | Admit: 2022-02-18 | Discharge: 2022-02-18 | Disposition: A | Discharge status: Home / Self Care | Payer: Medicaid Other | Source: Ambulatory Visit | Attending: Physician Assistant | Admitting: Physician Assistant

## 2022-02-18 ENCOUNTER — Encounter (INDEPENDENT_AMBULATORY_CARE_PROVIDER_SITE_OTHER): Payer: Self-pay | Admitting: Physician Assistant

## 2022-02-18 VITALS — Ht 70.0 in | Wt 180.0 lb

## 2022-02-18 DIAGNOSIS — S52552D Other extraarticular fracture of lower end of left radius, subsequent encounter for closed fracture with routine healing: Secondary | ICD-10-CM

## 2022-02-18 NOTE — Progress Notes (Unsigned)
[facility]    [address]  [place] [address]  Dept: [phone]  Dept Fax: [phone]        RETURN PATIENT OFFICE VISIT      PATIENT NAME:  Thomas Esparza  MEDICAL RECORD NUMBER: [id number]    DICTATING PHYSICIAN:  Hughes Better, PA-C  REFERRING PHYSICIAN:  Renne Crigler, MD    DOB:  [birth year]  DOS:  02/18/2022      CHIEF COMPLAINT:   Chief Complaint   Patient presents with   . Closed Distal Radius Fracture     LEFT WRIST        HISTORY OF PRESENT ILLNESS:    Thomas Esparza is a 61 y.o. male who presents to the office today for followup of his left wrist fracture, date of injury 01/18/2022.  Patient complains of increased pain while running a lawn mower.  He localizes his pain to be along the distal radius.  He also states that his cast has became slightly loose and soiled.  He is requesting additional pain medication.  No other complaints or concerns are voiced.      PAST MEDICAL HISTORY:  Past Medical History:   Diagnosis Date   . Closed fracture of left distal radius            PAST SURGICAL HISTORY:  No past surgical history is listed        FAMILY HISTORY:  Family Medical History:    None           SOCIAL HISTORY:  Social History     Socioeconomic History   . Marital status: Single     Spouse name: Not on file   . Number of children: Not on file   . Years of education: Not on file   . Highest education level: Not on file   Occupational History   . Not on file   Tobacco Use   . Smoking status: Every Day   . Smokeless tobacco: Current     Types: Chew   Substance and Sexual Activity   . Alcohol use: Yes     Alcohol/week: 4.0 standard drinks     Types: 4 Cans of beer per week   . Drug use: Never   . Sexual activity: Not on file   Other Topics Concern   . Not on file   Social History Narrative   . Not on file     Social Determinants of Health     Financial Resource Strain: Not on file   Transportation Needs: Not on file   Social Connections: Not on  file   Intimate Partner Violence: Not on file   Housing Stability: Not on file       ALLERGIES:  No Known Allergies    MEDICATIONS:  Current Outpatient Medications   Medication Sig Dispense Refill   . traMADoL (ULTRAM) 50 mg Oral Tablet Take 1 Tablet (50 mg total) by mouth Every 6 hours as needed for Pain 12 Tablet 0     No current facility-administered medications for this visit.         REVIEW OF SYMPTOMS:  Patient denies fever, chills, chest pain or shortness of breath. All other review of systems reviewed and were negative.        PHYSICAL EXAM:    Vitals:    02/18/22 1039   Weight: 81.6 kg (180 lb)   Height: 1.778 m (5\' 10" )  Body mass index is 25.83 kg/m.    General: Healthy and cooperative,  no acute distress  Psychiatric: Normal mood and affect, Alert, oriented to time, person, place   Lungs:  Respirations symmetric and non-labored  Cardiac: Regular rate and rhythm     Skin:   No scars, lesions or rashes or erythema  Lymphatic: No lymphadenopathy  Neurologic: Dermatomal sensation intact    Musculoskeletal:    Left wrist:  His cast is loose.  His cast was changed.  There is no deformity or significant swelling noted.  He was placed in a new properly padded, well-molded, short-arm fiberglass cast and again educated on cast care.  Remained neurovascularly intact post casting.      IMAGING:    Thomas Esparza    PROCEDURE DESCRIPTION: XR WRIST LEFT    TECHNIQUE: 3 views / 3 images submitted.    CLINICAL INDICATION: S52.552D: Other closed extra-articular fracture of distal end of left radius with routine healing, subsequent encounter    COMPARISON: 01/29/2022      FINDINGS: 3 views left wrist show healing distal radius fracture with stable alignment. Cast is in place.    IMPRESSION:  Healing distal radius fracture.        ASSESSMENT:      ICD-10-CM    1. Other closed extra-articular fracture of distal end of left radius with routine healing, subsequent encounter  S52.552D            PLAN:      Today,  I would a lengthy discussion with the patient regarding his continued care.  I recommended that he continue to avoid at-risk activities.  He was instructed to contact the office with any cast issues.  Otherwise, he will follow up in 2 weeks for a repeat x-ray and clinical assessment once his cast has been removed.  It was also explained that he should be able to take over-the-counter Tylenol for his pain this far out from his injury.  We do not recommend any further narcotic pain management.    Patient seemed pleased and agreeable to this plan. All questions were answered to the patient's satisfaction.    This note was partially generated using MModal Fluency Direct system, and there may be some incorrect words, spellings, and punctuation that were not noted in checking the note before saving.      Hughes Better, PA-C 02/18/2022, 11:31

## 2022-02-27 ENCOUNTER — Other Ambulatory Visit (INDEPENDENT_AMBULATORY_CARE_PROVIDER_SITE_OTHER): Payer: Self-pay | Admitting: Physician Assistant

## 2022-02-27 DIAGNOSIS — S52552D Other extraarticular fracture of lower end of left radius, subsequent encounter for closed fracture with routine healing: Secondary | ICD-10-CM

## 2022-03-04 ENCOUNTER — Other Ambulatory Visit: Payer: Self-pay

## 2022-03-04 ENCOUNTER — Encounter (INDEPENDENT_AMBULATORY_CARE_PROVIDER_SITE_OTHER): Payer: Self-pay | Admitting: Physician Assistant

## 2022-03-04 ENCOUNTER — Ambulatory Visit: Payer: Medicaid Other | Attending: Physician Assistant | Admitting: Physician Assistant

## 2022-03-04 ENCOUNTER — Ambulatory Visit (HOSPITAL_COMMUNITY)
Admission: RE | Admit: 2022-03-04 | Discharge: 2022-03-04 | Disposition: A | Discharge status: Home / Self Care | Payer: Medicaid Other | Source: Ambulatory Visit | Attending: Physician Assistant | Admitting: Physician Assistant

## 2022-03-04 VITALS — Ht 70.0 in | Wt 180.0 lb

## 2022-03-04 DIAGNOSIS — S52552D Other extraarticular fracture of lower end of left radius, subsequent encounter for closed fracture with routine healing: Secondary | ICD-10-CM

## 2022-03-04 NOTE — Progress Notes (Signed)
[facility]  [place] [address]  Dept: [phone]  Dept Fax: [phone]        RETURN PATIENT OFFICE VISIT      PATIENT NAME:  Thomas Esparza    DICTATING PHYSICIAN:  Lenore Cordia, PA-C  REFERRING PHYSICIAN:  Darden Dates, MD    DOB:  06-20-1961  DOS:  03/04/2022      CHIEF COMPLAINT:   Chief Complaint   Patient presents with   . Wrist Fracture     LEFT WRIST          HISTORY OF PRESENT ILLNESS:    Thomas Esparza is a 61 y.o. male who presents to the office today for followup of his left distal radius fracture, date of injury 01/18/2022.  Patient states he is doing well aside from some minor pain and stiffness in the wrist since his cast was removed.  No other complaints or concerns are voiced today.        PAST MEDICAL HISTORY:  Past Medical History:   Diagnosis Date   . Closed fracture of left distal radius            PAST SURGICAL HISTORY:  No past surgical history is listed.        FAMILY HISTORY:  Family Medical History:    None           SOCIAL HISTORY:  Social History     Socioeconomic History   . Marital status: Single     Spouse name: Not on file   . Number of children: Not on file   . Years of education: Not on file   . Highest education level: Not on file   Occupational History   . Not on file   Tobacco Use   . Smoking status: Every Day   . Smokeless tobacco: Current     Types: Chew   Substance and Sexual Activity   . Alcohol use: Yes     Alcohol/week: 4.0 standard drinks     Types: 4 Cans of beer per week   . Drug use: Never   . Sexual activity: Not on file   Other Topics Concern   . Not on file   Social History Narrative   . Not on file     Social Determinants of Health     Financial Resource Strain: Not on file   Transportation Needs: Not on file   Social Connections: Not on file   Intimate Partner Violence: Not on file   Housing Stability: Not on file       ALLERGIES:  No Known  Allergies    MEDICATIONS:  No current outpatient medications on file.     No current facility-administered medications for this visit.         REVIEW OF SYMPTOMS:  Patient denies fever, chills, chest pain or shortness of breath. All other review of systems reviewed and were negative.        PHYSICAL EXAM:    Vitals:    03/04/22 1358   Weight: 81.6 kg (180 lb)   Height: 1.778 m (5\' 10" )   PainSc:   7   PainLoc: Wrist    Body mass index is 25.83 kg/m.    General: Healthy and cooperative,  no acute distress  Psychiatric: Normal mood and affect, Alert, oriented to time, person, place   Lungs:  Respirations symmetric and  non-labored  Cardiac: Regular rate and rhythm     Skin:   No scars, lesions or rashes or erythema  Lymphatic: No lymphadenopathy  Neurologic: Dermatomal sensation intact    Musculoskeletal:    Left wrist:  He is mildly tender along the distal radial metaphysis.  He lacks approximately 10-15 degrees of wrist flexion and extension.  He is able to come to complete pronation and supination.  He is sensory intact to light touch with brisk capillary refill.  Neurovascularly intact.      IMAGING:    Thomas Esparza    PROCEDURE DESCRIPTION: XR WRIST LEFT    TECHNIQUE: 3 views / 3 images submitted.    CLINICAL INDICATION: S52.552D: Other closed extra-articular fracture of distal end of left radius with routine healing, subsequent encounter    COMPARISON: 02/18/2022      FINDINGS: 3 views left wrist show the cast has been removed. Healing distal radius fracture noted with stable alignment.    IMPRESSION:  Continued healing at the distal radius fracture with stable alignment.      ASSESSMENT:      ICD-10-CM    1. Other closed extra-articular fracture of distal end of left radius with routine healing, subsequent encounter  S52.552D            PLAN:      Today, I would a lengthy discussion with the patient regarding his continued care.  He was transitioned to a wrist brace.  He may remove the brace for  hygiene purposes and for gentle range-of-motion exercises.  Should continue with protected activities.  He was instructed to contact the office with any concerns and will follow up in 1 month for his final x-ray and clinical assessment.    Patient seemed pleased and agreeable to this plan. All questions were answered to the patient's satisfaction.    This note was partially generated using MModal Fluency Direct system, and there may be some incorrect words, spellings, and punctuation that were not noted in checking the note before saving.      Lenore Cordia, PA-C 03/04/2022, 14:09

## 2022-03-24 ENCOUNTER — Other Ambulatory Visit: Payer: Self-pay | Admitting: Cardiovascular Disease

## 2022-04-07 ENCOUNTER — Other Ambulatory Visit (INDEPENDENT_AMBULATORY_CARE_PROVIDER_SITE_OTHER): Payer: Self-pay | Admitting: Physician Assistant

## 2022-04-07 DIAGNOSIS — S52552D Other extraarticular fracture of lower end of left radius, subsequent encounter for closed fracture with routine healing: Secondary | ICD-10-CM

## 2022-04-09 ENCOUNTER — Ambulatory Visit (HOSPITAL_COMMUNITY)
Admission: RE | Admit: 2022-04-09 | Discharge: 2022-04-09 | Disposition: A | Discharge status: Home / Self Care | Payer: Medicaid Other | Source: Ambulatory Visit | Attending: Physician Assistant | Admitting: Physician Assistant

## 2022-04-09 ENCOUNTER — Other Ambulatory Visit: Payer: Self-pay

## 2022-04-09 ENCOUNTER — Ambulatory Visit: Payer: Medicaid Other | Attending: Physician Assistant | Admitting: Physician Assistant

## 2022-04-09 ENCOUNTER — Encounter (INDEPENDENT_AMBULATORY_CARE_PROVIDER_SITE_OTHER): Payer: Self-pay | Admitting: Physician Assistant

## 2022-04-09 VITALS — Ht 70.0 in | Wt 180.0 lb

## 2022-04-09 DIAGNOSIS — S52552A Other extraarticular fracture of lower end of left radius, initial encounter for closed fracture: Secondary | ICD-10-CM | POA: Insufficient documentation

## 2022-04-09 DIAGNOSIS — S52552D Other extraarticular fracture of lower end of left radius, subsequent encounter for closed fracture with routine healing: Secondary | ICD-10-CM

## 2022-04-09 NOTE — Progress Notes (Signed)
Orthopaedics & Sports Medicine    [address]  [place] [address]  Dept: [phone]  Dept Fax: [phone]        RETURN PATIENT OFFICE VISIT      PATIENT NAME:  Thomas Esparza  MEDICAL RECORD NUMBER: [id number]    DICTATING PHYSICIAN:  Hughes Better, PA-C  REFERRING PHYSICIAN:  Renne Crigler, MD    DOB:  08/01/1961  DOS:  04/09/2022      CHIEF COMPLAINT:   Chief Complaint   Patient presents with    Closed Distal Radius Fracture     LEFT WRIST DOI 01/18/2022        HISTORY OF PRESENT ILLNESS:    Thomas Esparza is a 61 y.o. male who presents to the office today for followup of his left distal radius fracture, date of injury 01/18/2022.  Patient states that he is doing well.  He has been utilizing his brace nearly at all times.  He has been working on hand range of motion but has not worked much on wrist range of motion.  He has no other complaints or concerns today.      PAST MEDICAL HISTORY:  Past Medical History:   Diagnosis Date    Closed fracture of left distal radius            PAST SURGICAL HISTORY:  No past surgical history is listed.        FAMILY HISTORY:  Family Medical History:    None           SOCIAL HISTORY:  Social History     Socioeconomic History    Marital status: Single     Spouse name: Not on file    Number of children: Not on file    Years of education: Not on file    Highest education level: Not on file   Occupational History    Not on file   Tobacco Use    Smoking status: Every Day    Smokeless tobacco: Current     Types: Chew   Substance and Sexual Activity    Alcohol use: Yes     Alcohol/week: 4.0 standard drinks     Types: 4 Cans of beer per week    Drug use: Never    Sexual activity: Not on file   Other Topics Concern    Not on file   Social History Narrative    Not on file     Social Determinants of Health     Financial Resource Strain: Not on file   Transportation Needs: Not on file   Social Connections: Not on file   Intimate Partner Violence:  Not on file   Housing Stability: Not on file       ALLERGIES:  No Known Allergies    MEDICATIONS:  No current outpatient medications on file.     No current facility-administered medications for this visit.         REVIEW OF SYMPTOMS:  Patient denies fever, chills, chest pain or shortness of breath. All other review of systems reviewed and were negative.        PHYSICAL EXAM:    Vitals:    04/09/22 1048   Weight: 81.6 kg (180 lb)   Height: 1.778 m (5\' 10" )   PainSc:   7   PainLoc: Wrist    Body mass index is 25.83 kg/m.    General: Healthy and cooperative,  no acute distress  Psychiatric:  Normal mood and affect, Alert, oriented to time, person, place   Lungs:  Respirations symmetric and non-labored  Cardiac: Regular rate and rhythm     Skin:   No scars, lesions or rashes or erythema  Lymphatic: No lymphadenopathy  Neurologic: Dermatomal sensation intact    Musculoskeletal:    Left wrist:  His brace was removed.  He has no significant tenderness along the distal radius.  He demonstrates symmetrical flexion but lacks approximately 10 of extension.  He does come to complete pronosupination.  Neurovascularly intact.      IMAGING:    Joann D Masterson     PROCEDURE DESCRIPTION: XR WRIST LEFT     PROCEDURE PERFORMED DATE AND TIME: 04/09/2022 10:30 AM     CLINICAL INDICATION: S52.552D: Other closed extra-articular fracture of distal end of left radius with routine healing, subsequent encounter     TECHNIQUE: 3 views / 3 images submitted.     COMPARISON: 03/04/2022        IMPRESSION:  Findings/impression: Unchanged alignment of healing fracture distal radius. No significant displacement. No new abnormality.       ASSESSMENT:      ICD-10-CM    1. Other closed extra-articular fracture of distal end of left radius with routine healing, subsequent encounter  S52.552D            PLAN:      Today, I would a lengthy discussion with the patient regarding his continued care.  He may transition out of his brace as tolerated.  He does  not wish to attend formal physical therapy.  He will continue to perform a home exercise program to help improve his wrist range of motion especially extension.  He was instructed to contact the office if he fails to continue to improve as expected her house any recurrence of pain or other issues.  Otherwise, he will follow up on an as-needed basis.    Patient seemed pleased and agreeable to this plan. All questions were answered to the patient's satisfaction.    This note was partially generated using MModal Fluency Direct system, and there may be some incorrect words, spellings, and punctuation that were not noted in checking the note before saving.      Hughes Better, PA-C 04/09/2022, 11:09

## 2022-04-28 ENCOUNTER — Other Ambulatory Visit: Payer: BC Managed Care – PPO

## 2022-04-28 DIAGNOSIS — Z79899 Other long term (current) drug therapy: Secondary | ICD-10-CM | POA: Diagnosis not present

## 2022-04-28 DIAGNOSIS — E782 Mixed hyperlipidemia: Secondary | ICD-10-CM

## 2022-04-28 LAB — LIPID PANEL
Chol/HDL Ratio: 3.1 ratio (ref 0.0–5.0)
Cholesterol, Total: 139 mg/dL (ref 100–199)
HDL: 45 mg/dL (ref >39)
LDL Chol Calc (NIH): 79 mg/dL (ref 0–99)
Triglycerides: 76 mg/dL (ref 0–149)
VLDL Cholesterol Cal: 15 mg/dL (ref 5–40)

## 2022-09-28 ENCOUNTER — Other Ambulatory Visit: Payer: Self-pay | Admitting: Cardiovascular Disease

## 2022-10-25 ENCOUNTER — Other Ambulatory Visit: Payer: Self-pay

## 2022-10-25 ENCOUNTER — Encounter (HOSPITAL_COMMUNITY): Payer: Self-pay

## 2022-10-25 ENCOUNTER — Emergency Department (HOSPITAL_COMMUNITY): Payer: Medicaid Other | Admitting: Radiology

## 2022-10-25 ENCOUNTER — Emergency Department
Admission: EM | Admit: 2022-10-25 | Discharge: 2022-10-25 | Disposition: A | Discharge status: Home / Self Care | Payer: Medicaid Other | Attending: Emergency Medicine | Admitting: Emergency Medicine

## 2022-10-25 ENCOUNTER — Emergency Department (HOSPITAL_COMMUNITY): Payer: Medicaid Other

## 2022-10-25 DIAGNOSIS — L0390 Cellulitis, unspecified: Secondary | ICD-10-CM

## 2022-10-25 DIAGNOSIS — L03116 Cellulitis of left lower limb: Secondary | ICD-10-CM | POA: Insufficient documentation

## 2022-10-25 DIAGNOSIS — S93492A Sprain of other ligament of left ankle, initial encounter: Secondary | ICD-10-CM | POA: Insufficient documentation

## 2022-10-25 DIAGNOSIS — W1842XA Slipping, tripping and stumbling without falling due to stepping into hole or opening, initial encounter: Secondary | ICD-10-CM | POA: Insufficient documentation

## 2022-10-25 MED ORDER — AMOXICILLIN 875 MG-POTASSIUM CLAVULANATE 125 MG TABLET
1.00 | ORAL_TABLET | ORAL | Status: AC
Start: 2022-10-25 — End: 2022-10-25
  Administered 2022-10-25: 1 via ORAL
  Filled 2022-10-25: qty 1

## 2022-10-25 MED ORDER — AMOXICILLIN 875 MG-POTASSIUM CLAVULANATE 125 MG TABLET
1.00 | ORAL_TABLET | Freq: Two times a day (BID) | ORAL | 0 refills | Status: AC
Start: 2022-10-25 — End: 2022-11-01

## 2022-10-25 MED ORDER — IBUPROFEN 800 MG TABLET: 800.0000 mg | ORAL_TABLET | Freq: Four times a day (QID) | ORAL | 0 refills | Status: DC | PRN

## 2022-10-25 NOTE — ED Triage Notes (Signed)
Left ankle pain since yesterday.   States that he stepped in a hole while feeding the squirrels yesterday and ankle has not gotten any better.

## 2022-10-25 NOTE — ED Provider Notes (Signed)
[facility] - Emergency Department  ED Primary Provider Note  History of Present Illness   Chief Complaint   Patient presents with    Ankle Pain     Arrival: The patient arrived by private vehicle  Thomas Esparza is a 62 y.o. male who had concerns including Ankle Pain.  Patient is a 62 year old male who presents to the ED with complaints of ankle pain.  Patient states he injured his left ankle yesterday.  Patient states he stepped in a hole while feeding squirrels and twisted his ankle.  He denies any other injury.  Patient states that the pain is worse with ambulation weight-bearing.  He does not rate the pain for me.  He has not taking any over-the-counter medications for symptom relief.    Review of Systems   Constitutional: No fever   Skin: No rash   GI:  No nausea, vomiting  MSK: + left ankle pain    All other systems reviewed and are negative.    Historical Data   History Reviewed This Encounter: Medical History  Surgical History  Family History  Social History    Physical Exam   ED Triage Vitals [10/25/22 1604]   BP (Non-Invasive) 134/87   Heart Rate 97   Respiratory Rate 17   Temperature 36.7 C (98 F)   SpO2 97 %   Weight    Height        Constitutional:  62 y.o. male who appears in no distress. Normal color, no cyanosis.   Cardiovascular: RRR, No murmurs, rubs or gallops. Intact distal pulses.  Pulmonary/Chest: BS equal bilaterally. No respiratory distress. No wheezes, rales or chest tenderness.   Musculoskeletal: + tenderness over the left ATF ligament.  Skin: warm and dry. + erythema over the lateral aspect of the left foot/ankle.  Poor hygiene noted.    Patient Data   Labs Ordered/Reviewed - No data to display  XR ANKLE LEFT   Final Result by Edi, Radresults In (02/03 1632)   Soft tissue swelling.         Radiologist location ID: [place] Decision Making   MDM Narrative: Thomas Esparza is a 62 y.o.. male presenting to the emergency department and had concerns  including Ankle Pain.  Patient is a 62 year old male who presents to the ED with complaints of left ankle pain.  Differential diagnoses reviewed with the patient.  Diagnostic imaging ordered and reviewed with patient.  Left ankle x-ray shows soft tissue swelling.  Patient received Augmentin 875 mg while in ED for treatment for his cellulitis.  Patient was given the opportunity to ask questions.  Vitals good throughout ED visit.  Ace bandage applied.  No neurovascular compromise after Ace placement.  Patient was agreeable to discharge planning.     Problems addressed during this encounter: {    Impression:   Clinical Impression   Sprain of anterior talofibular ligament of left ankle, initial encounter (Primary)   Cellulitis, unspecified cellulitis site     Home medications remain unchanged.      Patient was given:  Medications Administered in the ED   amoxicillin-clavulanate (AUGMENTIN) 875-125mg  per tablet (has no administration in time range)     Patient was deemed appropriate for discharge.  Discharged      Medications Administered in the ED   amoxicillin-clavulanate (AUGMENTIN) 875-125mg  per tablet (has no administration in time range)     Disposition:  Discharged  Discharge.  Augmentin 875 mg twice daily x7 days.  Ice.  Elevate.  Compression.  Return ED should symptoms worsen or persist.  Ibuprofen 800 mg Q 8 p.r.n.Marland Kitchen  Patient verbalized understanding of discharge instructions.

## 2022-10-25 NOTE — ED Nurses Note (Addendum)
Left foot cleaned with chlorhexadine.

## 2022-10-25 NOTE — ED Nurses Note (Signed)
Patient given discharge instructions and x 2 printed prescriptions, verbalized understanding. Patient ambulated from ED unassisted, without difficulty.

## 2022-10-25 NOTE — ED Nurses Note (Signed)
Patient soaking left foot in chlorhexadine water solution.

## 2023-02-20 ENCOUNTER — Other Ambulatory Visit: Payer: Self-pay | Admitting: Cardiovascular Disease

## 2023-06-29 ENCOUNTER — Other Ambulatory Visit: Payer: Self-pay | Admitting: Cardiovascular Disease

## 2023-08-06 NOTE — Progress Notes (Signed)
Cardiology Office Note:    Date:  08/07/2023   ID:  Derrill Memo, DOB Jul 06, 1961, MRN [id number]  PCP:  Patient, No Pcp Per   [facility] Providers Cardiologist:  Tonny Bollman, MD     Referring MD: No ref. provider found   Chief Complaint  Patient presents with   Coronary artery disease involving native coronary artery of   Follow-up    1 year    History of Present Illness:    Alan Walker is a 62 y.o. male with a hx of CAD who initially presented in 2016 with an inferior wall MI.  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  Medical therapy was recommended at that time.  He has had no recurrent ischemic events.   The patient is here alone today.  He has been doing fine with no chest pain, shortness of breath, edema, orthopnea, or PND.  He continues to exercise regularly with no exertional symptoms.  He reports occasional palpitations with no change over time.  No lightheadedness or syncope.   Current Medications: Current Meds  Medication Sig   amLODipine (NORVASC) 5 MG tablet Take 1 tablet (5 mg total) by mouth daily.   aspirin EC 81 MG tablet Take 81 mg by mouth daily.   atorvastatin (LIPITOR) 40 MG tablet TAKE 1 TABLET(40 MG) BY MOUTH DAILY   carvedilol (COREG) 12.5 MG tablet TAKE 1 TABLET(12.5 MG) BY MOUTH TWICE DAILY WITH A MEAL     Allergies:   Patient has no known allergies.   ROS:   Please see the history of present illness.    All other systems reviewed and are negative.  EKGs/Labs/Other Studies Reviewed:    The following studies were reviewed today: Cardiac Studies & Procedures     STRESS TESTS  EXERCISE TOLERANCE TEST (ETT) 10/09/2015  Narrative  There was no ST segment deviation noted during stress.              EKG:   EKG Interpretation Date/Time:  Friday August 07 2023 08:48:24 EST Ventricular Rate:  57 PR Interval:  192 QRS Duration:  74 QT Interval:  408 QTC  Calculation: 397 R Axis:   4  Text Interpretation: Sinus bradycardia Inferior infarct (cited on or before 07-Aug-2023) Possible Anterior infarct , age undetermined When compared with ECG of 07-Aug-2023 08:45, lead placement has been corrected Confirmed by Tonny Bollman [phone]) on 08/07/2023 9:01:48 AM    Recent Labs: No results found for requested labs within last 365 days.  Recent Lipid Panel    Component Value Date/Time   CHOL 139 04/28/2022 0717   TRIG 76 04/28/2022 0717   HDL 45 04/28/2022 0717   CHOLHDL 3.1 04/28/2022 0717   CHOLHDL 3 11/24/2014 0835   VLDL 16.6 11/24/2014 0835   LDLCALC 79 04/28/2022 0717     Risk Assessment/Calculations:                Physical Exam:    VS:  BP 128/78 (BP Location: Left Arm, Patient Position: Sitting, Cuff Size: Large)   Pulse 66   Resp 16   Ht 5\' 10"  (1.778 m)   Wt 234 lb 12.8 oz (106.5 kg)   SpO2 97%   BMI 33.69 kg/m     Wt Readings from Last 3 Encounters:  08/07/23 234 lb 12.8 oz (106.5 kg)  01/15/22 224 lb (101.6 kg)  12/24/21 224 lb (101.6 kg)     GEN:  Well nourished, well  developed in no acute distress HEENT: Normal NECK: No JVD; No carotid bruits LYMPHATICS: No lymphadenopathy CARDIAC: RRR, no murmurs, rubs, gallops RESPIRATORY:  Clear to auscultation without rales, wheezing or rhonchi  ABDOMEN: Soft, non-tender, non-distended MUSCULOSKELETAL:  No edema; No deformity  SKIN: Warm and dry NEUROLOGIC:  Alert and oriented x 3 PSYCHIATRIC:  Normal affect   Assessment & Plan Coronary artery disease involving native coronary artery of native heart without angina pectoris Stable without symptoms of angina.  Continue aspirin for antiplatelet therapy and a high intensity statin drug.  Antianginal program includes carvedilol and amlodipine.  No changes are made today. Mixed hyperlipidemia Treated with atorvastatin 40 mg daily.  Last lipids with a LDL cholesterol of 79.  Will update his lipid panel today as he is  fasting. Essential hypertension Blood pressure is well-controlled.  Continue amlodipine and carvedilol.  Continue healthy lifestyle.      Medication Adjustments/Labs and Tests Ordered: Current medicines are reviewed at length with the patient today.  Concerns regarding medicines are outlined above.  Orders Placed This Encounter  Procedures   CBC   Comprehensive metabolic panel   TSH   Lipid panel   EKG 12-Lead   EKG 12-Lead   No orders of the defined types were placed in this encounter.   Patient Instructions  Lab Work: CBC, CMET, Lipids, TSH today If you have labs (blood work) drawn today and your tests are completely normal, you will receive your results only by: MyChart Message (if you have MyChart) OR A paper copy in the mail If you have any lab test that is abnormal or we need to change your treatment, we will call you to review the results.  Follow-Up: At [facility], you and your health needs are our priority.  As part of our continuing mission to provide you with exceptional heart care, we have created designated Provider Care Teams.  These Care Teams include your primary Cardiologist (physician) and Advanced Practice Providers (APPs -  Physician Assistants and Nurse Practitioners) who all work together to provide you with the care you need, when you need it.  Your next appointment:   1 year(s)  Provider:   Tonny Bollman, MD        Signed, Tonny Bollman, MD  08/07/2023 2:01 PM    [facility]

## 2023-08-07 ENCOUNTER — Ambulatory Visit: Payer: BC Managed Care – PPO | Attending: Cardiovascular Disease | Admitting: Cardiovascular Disease

## 2023-08-07 ENCOUNTER — Encounter: Payer: Self-pay | Admitting: Cardiovascular Disease

## 2023-08-07 VITALS — BP 128/78 | HR 66 | Resp 16 | Ht 70.0 in | Wt 234.8 lb

## 2023-08-07 DIAGNOSIS — I10 Essential (primary) hypertension: Secondary | ICD-10-CM | POA: Diagnosis not present

## 2023-08-07 DIAGNOSIS — E782 Mixed hyperlipidemia: Secondary | ICD-10-CM

## 2023-08-07 DIAGNOSIS — I2510 Atherosclerotic heart disease of native coronary artery without angina pectoris: Secondary | ICD-10-CM

## 2023-08-07 NOTE — Patient Instructions (Signed)
Lab Work: CBC, CMET, Lipids, TSH today If you have labs (blood work) drawn today and your tests are completely normal, you will receive your results only by: MyChart Message (if you have MyChart) OR A paper copy in the mail If you have any lab test that is abnormal or we need to change your treatment, we will call you to review the results.  Follow-Up: At [name], you and your health needs are our priority.  As part of our continuing mission to provide you with exceptional heart care, we have created designated Provider Care Teams.  These Care Teams include your primary Cardiologist (physician) and Advanced Practice Providers (APPs -  Physician Assistants and Nurse Practitioners) who all work together to provide you with the care you need, when you need it.  Your next appointment:   1 year(s)  Provider:   Tonny Bollman, MD

## 2023-08-08 LAB — COMPREHENSIVE METABOLIC PANEL
ALT: 21 [IU]/L (ref 0–44)
AST: 22 [IU]/L (ref 0–40)
Albumin: 3.9 g/dL (ref 3.9–4.9)
Alkaline Phosphatase: 57 [IU]/L (ref 44–121)
BUN/Creatinine Ratio: 14 (ref 10–24)
BUN: 13 mg/dL (ref 8–27)
Bilirubin Total: 0.6 mg/dL (ref 0.0–1.2)
CO2: 27 mmol/L (ref 20–29)
Calcium: 9.1 mg/dL (ref 8.6–10.2)
Chloride: 104 mmol/L (ref 96–106)
Creatinine, Ser: 0.92 mg/dL (ref 0.76–1.27)
Globulin, Total: 3.2 g/dL (ref 1.5–4.5)
Glucose: 94 mg/dL (ref 70–99)
Potassium: 4.2 mmol/L (ref 3.5–5.2)
Sodium: 140 mmol/L (ref 134–144)
Total Protein: 7.1 g/dL (ref 6.0–8.5)
eGFR: 94 mL/min/{1.73_m2} (ref >59)

## 2023-08-08 LAB — LIPID PANEL
Chol/HDL Ratio: 2.6 ratio (ref 0.0–5.0)
Cholesterol, Total: 140 mg/dL (ref 100–199)
HDL: 53 mg/dL (ref >39)
LDL Chol Calc (NIH): 77 mg/dL (ref 0–99)
Triglycerides: 43 mg/dL (ref 0–149)
VLDL Cholesterol Cal: 10 mg/dL (ref 5–40)

## 2023-08-08 LAB — CBC
Hematocrit: 37.6 % (ref 37.5–51.0)
Hemoglobin: 12.1 g/dL — ABNORMAL LOW (ref 13.0–17.7)
MCH: 30.9 pg (ref 26.6–33.0)
MCHC: 32.2 g/dL (ref 31.5–35.7)
MCV: 96 fL (ref 79–97)
Platelets: 283 10*3/uL (ref 150–450)
RBC: 3.92 x10E6/uL — ABNORMAL LOW (ref 4.14–5.80)
RDW: 11.5 % — ABNORMAL LOW (ref 11.6–15.4)
WBC: 4.3 10*3/uL (ref 3.4–10.8)

## 2023-08-08 LAB — TSH: TSH: 1.49 u[IU]/mL (ref 0.450–4.500)

## 2023-09-24 ENCOUNTER — Other Ambulatory Visit: Payer: Self-pay | Admitting: Cardiovascular Disease

## 2023-10-06 ENCOUNTER — Other Ambulatory Visit: Payer: Self-pay | Admitting: Cardiovascular Disease

## 2024-03-20 ENCOUNTER — Other Ambulatory Visit: Payer: Self-pay

## 2024-03-20 ENCOUNTER — Encounter (HOSPITAL_COMMUNITY): Payer: Self-pay

## 2024-03-20 ENCOUNTER — Emergency Department (HOSPITAL_COMMUNITY)
Admission: EM | Admit: 2024-03-20 | Discharge: 2024-03-21 | Disposition: A | Discharge status: Home / Self Care | Attending: Emergency Medicine | Admitting: Emergency Medicine

## 2024-03-20 DIAGNOSIS — S61011A Laceration without foreign body of right thumb without damage to nail, initial encounter: Secondary | ICD-10-CM | POA: Insufficient documentation

## 2024-03-20 DIAGNOSIS — Z7982 Long term (current) use of aspirin: Secondary | ICD-10-CM | POA: Diagnosis not present

## 2024-03-20 DIAGNOSIS — W268XXA Contact with other sharp object(s), not elsewhere classified, initial encounter: Secondary | ICD-10-CM | POA: Diagnosis not present

## 2024-03-20 NOTE — ED Triage Notes (Signed)
 Pt came in via POV d/t cutting his hand on aluminum from his metal shed. Does have a small lac to the knuckle of his Rt thumb. Bleeding controlled, was uncovered & clean drg applied while in triage. A/Ox4,denies pain while in triage.

## 2024-03-21 NOTE — ED Provider Notes (Signed)
 [place] EMERGENCY DEPARTMENT AT [facility] Provider Note   CSN: [id number] Arrival date & time: 03/20/24  1800     Patient presents with: No chief complaint on file.   Alan Walker is a 64 y.o. male.   HPI     Prior to Admission medications   Medication Sig Start Date End Date Taking? Authorizing Provider  amLODipine  (NORVASC ) 5 MG tablet TAKE 1 TABLET(5 MG) BY MOUTH DAILY 09/24/23   Wonda Sharper, MD  aspirin  EC 81 MG tablet Take 81 mg by mouth daily.    [provider]  atorvastatin  (LIPITOR) 40 MG tablet TAKE 1 TABLET(40 MG) BY MOUTH DAILY 06/29/23   Wonda Sharper, MD  carvedilol  (COREG ) 12.5 MG tablet TAKE 1 TABLET(12.5 MG) BY MOUTH TWICE DAILY WITH A MEAL 10/07/23   Wonda Sharper, MD    Allergies: Patient has no known allergies.    Review of Systems  Skin:  Positive for wound.    Updated Vital Signs BP 129/87   Pulse (!) 55   Temp 97.9 F (36.6 C) (Oral)   Resp 14   Ht 5' 10 (1.778 m)   Wt 102.1 kg   SpO2 100%   BMI 32.28 kg/m   Physical Exam Vitals and nursing note reviewed.  Constitutional:      Appearance: Alan Walker is not ill-appearing or toxic-appearing.  HENT:     Head: Normocephalic and atraumatic.   Eyes:     General: No scleral icterus.       Right eye: No discharge.        Left eye: No discharge.     Conjunctiva/sclera: Conjunctivae normal.   Pulmonary:     Effort: Pulmonary effort is normal.   Musculoskeletal:       Hands:   Skin:    General: Skin is warm and dry.   Neurological:     General: No focal deficit present.     Mental Status: Alan Walker is alert.   Psychiatric:        Mood and Affect: Mood normal.     (all labs ordered are listed, but only abnormal results are displayed) Labs Reviewed - No data to display  EKG: None  Radiology: No results found.   .Laceration Repair  Date/Time: 03/21/2024 1:42 AM  Performed by: Bobette Pleasant SAUNDERS, PA-C Authorized by: Bobette Pleasant SAUNDERS, PA-C    Consent:    Consent obtained:  Verbal   Consent given by:  Patient   Risks, benefits, and alternatives were discussed: yes     Risks discussed:  Infection, poor cosmetic result and pain   Alternatives discussed:  No treatment Universal protocol:    Patient identity confirmed:  Verbally with patient Anesthesia:    Anesthesia method:  None Laceration details:    Location:  Finger   Finger location:  R thumb   Length (cm):  0.8 Treatment:    Area cleansed with:  Saline   Amount of cleaning:  Standard Skin repair:    Repair method:  Tissue adhesive Approximation:    Approximation:  Close Repair type:    Repair type:  Simple    Medications Ordered in the ED - No data to display                                  Medical Decision Making 63 y/o male who presents with concern for lac to the R thumb.   VS  normal on intake. Lac as above, hemostatic   Wound repaired as above. Clinical concern for emergent underlying condition that would warrant further ED workup or inpatient management is exceedingly low   Alan Walker voiced understanding of his medical evaluation and treatment plan. Each of their questions answered to their expressed satisfaction.  Return precautions were given.  Patient is well-appearing, stable, and was discharged in good condition.  This chart was dictated using voice recognition software, Dragon. Despite the best efforts of this provider to proofread and correct errors, errors may still occur which can change documentation meaning.      Final diagnoses:  Laceration of right thumb without foreign body without damage to nail, initial encounter    ED Discharge Orders     None          Bobette Pleasant JONELLE DEVONNA 03/21/24 0145    Palumbo, April, MD 03/21/24 0221

## 2024-03-21 NOTE — ED Notes (Signed)
 Wound cleaned with wound cleaner, gauze wrapped over it.

## 2024-03-21 NOTE — Discharge Instructions (Signed)
 Your cut was repaired with skin glue. Please follow up with your PCP. Return to the ER with any new severe symptoms.

## 2024-08-16 ENCOUNTER — Other Ambulatory Visit: Payer: Self-pay | Admitting: Cardiovascular Disease

## 2024-08-23 ENCOUNTER — Other Ambulatory Visit: Payer: Self-pay | Admitting: Cardiovascular Disease

## 2024-09-23 ENCOUNTER — Emergency Department (HOSPITAL_COMMUNITY): Admitting: Radiology

## 2024-09-23 ENCOUNTER — Encounter (HOSPITAL_COMMUNITY): Payer: Self-pay

## 2024-09-23 ENCOUNTER — Other Ambulatory Visit: Payer: Self-pay

## 2024-09-23 ENCOUNTER — Emergency Department
Admission: EM | Admit: 2024-09-23 | Discharge: 2024-09-23 | Disposition: A | Discharge status: Home / Self Care | Attending: Emergency Medicine | Admitting: Emergency Medicine

## 2024-09-23 ENCOUNTER — Emergency Department (HOSPITAL_COMMUNITY)

## 2024-09-23 DIAGNOSIS — W19XXXA Unspecified fall, initial encounter: Secondary | ICD-10-CM | POA: Insufficient documentation

## 2024-09-23 DIAGNOSIS — S62308A Unspecified fracture of other metacarpal bone, initial encounter for closed fracture: Secondary | ICD-10-CM

## 2024-09-23 DIAGNOSIS — S62324A Displaced fracture of shaft of fourth metacarpal bone, right hand, initial encounter for closed fracture: Secondary | ICD-10-CM | POA: Insufficient documentation

## 2024-09-23 NOTE — ED Nurses Note (Signed)
 Splint applied per physician instructions; physician okayed final splint. Discharge paperwork and instructions given. Patient denies any questions or concerns at this time. Patient ambulatory out of ER.

## 2024-09-23 NOTE — ED Nurses Note (Signed)
"  Xray at bedside.   "

## 2024-09-23 NOTE — ED Provider Notes (Signed)
 [facility] - [facility] Department  ED Primary Note  History of Present Illness  Thomas Esparza is a 64 year old male who presents with right wrist pain after a fall.    He has right wrist pain following a fall into trash while sorting through it. The pain is localized around the wrist, and the area is described as 'a little puffy'.    Swelling began a while ago, prompting him to seek medical attention. He also experiences difficulty moving one of his fingers, stating 'the socket's out', and is unable to move it very well.    He can move his wrist, although it causes pain, and he is able to make a fist.  Physical Exam   ED Triage Vitals [09/23/24 2212]   BP (Non-Invasive) 126/75   Heart Rate 93   Respiratory Rate 18   Temperature 36.7 C (98.1 F)   SpO2 99 %   Weight 77.7 kg (171 lb 6.4 oz)   Height 1.778 m (5' 10)     Physical Exam  GENERAL: Alert, cooperative, well developed, no acute distress.  HEENT: Normocephalic, normal oropharynx, moist mucous membranes.  CHEST: Clear to auscultation bilaterally, no wheezes, rhonchi, or crackles.  CARDIOVASCULAR: Normal heart rate and rhythm, S1 and S2 normal without murmurs.  ABDOMEN: Soft, non-tender, non-distended, without organomegaly, normal bowel sounds.  EXTREMITIES: No cyanosis or edema.  MUSCULOSKELETAL: Right wrist pain and swelling, limited finger movement with inability to make a fist, and painful wrist movement.  NEUROLOGICAL: Cranial nerves grossly intact, moves all extremities without gross motor or sensory deficit.  Patient Data   Results      Labs Ordered/Reviewed - No data to display  No orders to display     Medical Decision Making   {?? Select the MDM button at the top of the note composer window and be sure to fill out the MDM SmartBlock in Notewriter to the ozqu:61845}  ED clinical impression missing, please click on the following link to add Clinical Impressions ***  then refresh the note prior to signing.    Medical Decision Making  A  64 year old male presented with right wrist pain, swelling, and limited finger movement following a fall while sorting through trash. Examination revealed localized swelling over the wrist, particularly near the ulna, and difficulty moving one finger. The patient was able to move his wrist, though with pain, and had difficulty making a fist. No independent historian was involved in providing the history.    Acute right wrist injury  - Order x-ray of the right wrist to assess for fractures or other injuries  MDM     Patient was vitally *** stable throughout visit.  Required *** medical therapy for pain/fever control.  Required *** medical therapy for nausea control.  Patient had *** improvement in symptoms over course of ED stay on reassessment.  Laboratory results ***.  Imaging  ***.  Results were discussed with patient.     If patient meets sepsis criteria he or she may have not received 30ml/kg IVF bolus due to CHF or concern for fluid overload. If a trauma patient it was not out of the department in 90 minutes secondary to delay in labs and imaging results as well as any consults or transport issues.    Return precautions discussed and understood to return to ER for worsening symptoms or other concerns otherwise follow up with ***  Prescriptions provided were***  ED clinical impression missing, please click on the following link to add Clinical Impressions ***  then refresh the note prior to signing.    Disposition: Data Unavailable  {?Quick Link  Level of Service Calculator:123}  {Critical Care Time (Optional):37527}     {?? Remember to refresh your note prior to signing. Use Control + F11 or click the refresh button at the bottom of the note:38154}  {Ambient Scribe Recording Consent (Optional):54667}

## 2024-09-26 ENCOUNTER — Ambulatory Visit: Attending: Orthopaedic Surgery | Admitting: Physician Assistant

## 2024-09-26 ENCOUNTER — Other Ambulatory Visit: Payer: Self-pay

## 2024-09-26 ENCOUNTER — Encounter (INDEPENDENT_AMBULATORY_CARE_PROVIDER_SITE_OTHER): Payer: Self-pay | Admitting: Physician Assistant

## 2024-09-26 DIAGNOSIS — S62324A Displaced fracture of shaft of fourth metacarpal bone, right hand, initial encounter for closed fracture: Secondary | ICD-10-CM | POA: Insufficient documentation

## 2024-09-26 NOTE — Progress Notes (Signed)
 Orthopaedics & [facility]  [address]  [place] [address]  Dept: [phone]  Dept Fax: [phone]        NEW PATIENT OFFICE VISIT      PATIENT NAME:  Thomas Esparza  MEDICAL RECORD NUMBER: [id number]    DICTATING PHYSICIAN:  Powell Cure, PA-C  REFERRING PHYSICIAN:  Gasper Birmingham, MD    DOB:  [birth year]  DOS:  09/26/2024      CHIEF COMPLAINT:   Chief Complaint   Patient presents with    Closed Metacarpal Fracture     Right 4th metacarpal doi 09/23/2024        HISTORY OF PRESENT ILLNESS:    EH SESAY is a 64 y.o. male who presents to the office today for initial consultation in regards to his right hand.  On September 23, 2024 he fell and hit his hand against a root of a tree.  He presented to the emergency department and was found to have a 4th metacarpal shaft fracture.  Patient was placed in a splint referred to Orthopedics for further management.  Patient states that he removed his splint following departure from the ED because he was unable to scratch his nose with the splint on.  He continues to complain of 5/10 pain if he uses it too much or tries to grip.  He also notices that he is unable to completely extend the ring finger.      PAST MEDICAL HISTORY:  Past Medical History:   Diagnosis Date    Closed fracture of left distal radius            PAST SURGICAL HISTORY:  History reviewed. No past surgical history pertinent negatives.        FAMILY HISTORY:  Family Medical History:    None           SOCIAL HISTORY:  Social History     Socioeconomic History    Marital status: Single     Spouse name: Not on file    Number of children: Not on file    Years of education: Not on file    Highest education level: Not on file   Occupational History    Not on file   Tobacco Use    Smoking status: Every Day    Smokeless tobacco: Current     Types: Chew   Vaping Use    Vaping status: Never Used   Substance and Sexual Activity    Alcohol use: Yes     Alcohol/week: 4.0  standard drinks of alcohol     Types: 4 Cans of beer per week    Drug use: Never    Sexual activity: Not on file   Other Topics Concern    Not on file   Social History Narrative    Not on file     Social Determinants of Health     Financial Resource Strain: Not on file   Transportation Needs: Not on file   Social Connections: Not on file   Intimate Partner Violence: Not on file   Housing Stability: Not on file       ALLERGIES:  Allergies[1]    MEDICATIONS:  No current outpatient medications on file.     No current facility-administered medications for this visit.         REVIEW OF SYMPTOMS:  Patient denies fever, chills, chest pain or shortness of breath. All other review of systems reviewed and were negative.  PHYSICAL EXAM:    Vitals:    09/26/24 1433   Weight: 77.7 kg (171 lb 4.8 oz)   Height: 1.778 m (5' 10)   PainSc:   5   PainLoc: Hand    Body mass index is 24.58 kg/m.    General: Healthy and cooperative,  no acute distress  Psychiatric: Normal mood and affect, Alert, oriented to time, person, place   Lungs:  Respirations symmetric and non-labored  Cardiac: Regular rate and rhythm     Skin:   No scars, lesions or rashes or erythema  Lymphatic: No lymphadenopathy  Neurologic: Dermatomal sensation intact    Musculoskeletal:    Right hand:  There swelling along the dorsum of the hand.  The swelling extends into the small and ring finger.  He is tender along the 4th metacarpal shaft.  He has an approximate 45 degree extensor lag.  There is no obvious rotational malalignment, scissoring or crossing over of the digits with gentle flexion.  He is nontender along the snuffbox.    He is nontender along the distal radius and ulna.  He is sensory intact to light touch with brisk capillary refill.      IMAGING:    Lyal D Chay     PROCEDURE DESCRIPTION: XR HAND RIGHT.  VIEWS: 3.  PROCEDURE DATE AND TIME: 09/23/2024 10:29 PM.  CLINICAL INDICATION: fall, 4 and 5th CMP pain and swelling. Concern for 4th MCP joint  either fx or dislocation.  COMPARISON: Correlated with the same-day wrist radiographs.      FINDINGS:  There is an acute spiral fracture in the proximal half of the 4th metacarpal shaft. There is posterior angulation with slight overriding and slight medial displacement of distal fragment.      IMPRESSION:  1.4th metacarpal fracture.      Ahren D Kovacevic     PROCEDURE DESCRIPTION: XR WRIST RIGHT     TECHNIQUE: 3 views / 3 images submitted.     CLINICAL INDICATION: ulnar pain s/p fall     COMPARISON: No prior studies were compared.        FINDINGS: Displaced fourth metacarpal shaft fracture. Degenerative changes at the wrist.     IMPRESSION:  1. Displaced fourth metacarpal fracture.     ASSESSMENT:    Assessment/Plan   1. Closed displaced fracture of shaft of fourth metacarpal bone of right hand, initial encounter         PLAN:      Today, I had a lengthy discussion with the patient regarding his right 4th metacarpal fracture.  We discussed that his metacarpal is shortened and the fracture is mild-to-moderately angulated which is causing him to have an extensor lag.  Patient was properly educated on operative versus non operative management.  I recommended a referral to Dr. Doreene to consider surgical intervention.  Patient is not interested in surgery.  He understands that without surgery his ring finger knuckle (MCP joint) will be less prominent and lower and that he may have a permanent extensor lag.  Patient is willing to accept these risks and deformities.  He wishes to continue with conservative treatment.  Today, he was placed in a properly padded, well-molded, gauntlet style ulnar gutter cast encompassing the long, ring and small fingers.  The 4th MCP joint was molded in extension to help improve his fracture alignment.  He will follow up in 1 week for repeat x-ray and cast check.  He was instructed to contact the office with any concerns in  the interim.    Patient seemed pleased and agreeable to this plan.  All questions were answered to the patient's satisfaction.    This note was partially generated using MModal Fluency Direct system, and there may be some incorrect words, spellings, and punctuation that were not noted in checking the note before saving.      Powell Cure, PA-C 09/26/2024, 15:18           [1] No Known Allergies

## 2024-09-29 ENCOUNTER — Other Ambulatory Visit (INDEPENDENT_AMBULATORY_CARE_PROVIDER_SITE_OTHER): Payer: Self-pay | Admitting: Physician Assistant

## 2024-09-29 DIAGNOSIS — S62324A Displaced fracture of shaft of fourth metacarpal bone, right hand, initial encounter for closed fracture: Secondary | ICD-10-CM

## 2024-10-03 ENCOUNTER — Ambulatory Visit (HOSPITAL_COMMUNITY)
Admission: RE | Admit: 2024-10-03 | Discharge: 2024-10-03 | Disposition: A | Source: Ambulatory Visit | Attending: Physician Assistant

## 2024-10-03 ENCOUNTER — Ambulatory Visit: Attending: Orthopaedic Surgery | Admitting: Physician Assistant

## 2024-10-03 ENCOUNTER — Other Ambulatory Visit: Payer: Self-pay

## 2024-10-03 VITALS — Ht 70.0 in | Wt 171.0 lb

## 2024-10-03 DIAGNOSIS — S62324A Displaced fracture of shaft of fourth metacarpal bone, right hand, initial encounter for closed fracture: Secondary | ICD-10-CM

## 2024-10-03 DIAGNOSIS — S62324D Displaced fracture of shaft of fourth metacarpal bone, right hand, subsequent encounter for fracture with routine healing: Secondary | ICD-10-CM | POA: Insufficient documentation

## 2024-10-03 NOTE — Progress Notes (Signed)
 [facility]ics [facility]    [address]  [place] [address]  Dept: [phone]  Dept Fax: [phone]        RETURN PATIENT OFFICE VISIT      PATIENT NAME:  Thomas Esparza  MEDICAL RECORD NUMBER: [id number]    DICTATING PHYSICIAN:  Powell Cure, PA-C  REFERRING PHYSICIAN:  Gasper Birmingham, MD    DOB:  Jul 08, 1961  DOS:  10/03/2024      CHIEF COMPLAINT:   Chief Complaint   Patient presents with    Closed Metacarpal Fracture     RIGHT HAND 4TH MC        HISTORY OF PRESENT ILLNESS:    Thomas Esparza is a 64 y.o. male who presents to the office today for followup of his right hand 4th metacarpal shaft fracture, date of injury 09/23/2024.  He is in a ulnar gutter cast that encompasses the lateral 3 digits.  He reports that his cast is loose.  Otherwise, he rates his pain 0/10.      PAST MEDICAL HISTORY:  Past Medical History:   Diagnosis Date    Closed fracture of left distal radius            PAST SURGICAL HISTORY:  None listed.        FAMILY HISTORY:  Family Medical History:    None           SOCIAL HISTORY:  Social History     Socioeconomic History    Marital status: Single     Spouse name: Not on file    Number of children: Not on file    Years of education: Not on file    Highest education level: Not on file   Occupational History    Not on file   Tobacco Use    Smoking status: Every Day    Smokeless tobacco: Current     Types: Chew   Vaping Use    Vaping status: Never Used   Substance and Sexual Activity    Alcohol use: Yes     Alcohol/week: 4.0 standard drinks of alcohol     Types: 4 Cans of beer per week    Drug use: Never    Sexual activity: Not on file   Other Topics Concern    Not on file   Social History Narrative    Not on file     Social Determinants of Health     Financial Resource Strain: Not on file   Transportation Needs: Not on file   Social Connections: Not on file   Intimate Partner Violence: Not on file   Housing Stability: Not on file        ALLERGIES:  Allergies[1]    MEDICATIONS:  No current outpatient medications on file.     No current facility-administered medications for this visit.         REVIEW OF SYMPTOMS:  Patient denies fever, chills, chest pain or shortness of breath. All other review of systems reviewed and were negative.        PHYSICAL EXAM:    Vitals:    10/03/24 1427   Weight: 77.6 kg (171 lb)   Height: 1.778 m (5' 10)    Body mass index is 24.54 kg/m.    General: Healthy and cooperative,  no acute distress  Psychiatric: Normal mood and affect, Alert, oriented to time, person, place   Lungs:  Respirations symmetric and non-labored  Cardiac: Regular rate  and rhythm     Skin:   No scars, lesions or rashes or erythema  Lymphatic: No lymphadenopathy  Neurologic: Dermatomal sensation intact    Musculoskeletal:    Right upper extremity:  His cast was removed.  He was placed in a new properly padded, well-molded, gauntlet style ulnar gutter cast encompassing the lateral 3 digits.  He remained neurovascularly intact.    IMAGING:    X-rays of the right hand were obtained today and reviewed.  There is evidence of a displaced midshaft fracture of the 4th metacarpal.  There has been slight increased interval displacement of the fracture.    ASSESSMENT:      ICD-10-CM    1. Closed displaced fracture of shaft of fourth metacarpal bone of right hand  S62.324A            PLAN:      Today, we discussed his continued care.  I offered a referral to Dr. Doreene to consider an ORIF.  Patient is not interested in surgery.  He understands that without surgery his fracture will heal in a suboptimal position that will cause him to have depression of the 4th MCP joint as well as a probable extensor lag.  Patient voices understanding and states that he is able to live with that.  We will continue with conservative treatment.  He was instructed to contact the office with any concerns and will follow up in 2 weeks for another x-ray and clinical  assessment.    Patient seemed pleased and agreeable to this plan. All questions were answered to the patient's satisfaction.    This note was partially generated using MModal Fluency Direct system, and there may be some incorrect words, spellings, and punctuation that were not noted in checking the note before saving.      Powell Cure, PA-C 10/03/2024, 14:36         [1] No Known Allergies

## 2024-10-10 ENCOUNTER — Other Ambulatory Visit (INDEPENDENT_AMBULATORY_CARE_PROVIDER_SITE_OTHER): Payer: Self-pay | Admitting: Physician Assistant

## 2024-10-10 DIAGNOSIS — S62324A Displaced fracture of shaft of fourth metacarpal bone, right hand, initial encounter for closed fracture: Secondary | ICD-10-CM

## 2024-10-17 ENCOUNTER — Ambulatory Visit (INDEPENDENT_AMBULATORY_CARE_PROVIDER_SITE_OTHER): Payer: Self-pay | Admitting: Physician Assistant

## 2024-10-18 ENCOUNTER — Ambulatory Visit (HOSPITAL_COMMUNITY)
Admission: RE | Admit: 2024-10-18 | Discharge: 2024-10-18 | Disposition: A | Source: Ambulatory Visit | Attending: Physician Assistant

## 2024-10-18 ENCOUNTER — Other Ambulatory Visit: Payer: Self-pay

## 2024-10-18 ENCOUNTER — Ambulatory Visit: Attending: Physician Assistant | Admitting: Physician Assistant

## 2024-10-18 VITALS — Ht 70.0 in | Wt 171.0 lb

## 2024-10-18 DIAGNOSIS — S62324D Displaced fracture of shaft of fourth metacarpal bone, right hand, subsequent encounter for fracture with routine healing: Secondary | ICD-10-CM | POA: Insufficient documentation

## 2024-10-18 DIAGNOSIS — S62324A Displaced fracture of shaft of fourth metacarpal bone, right hand, initial encounter for closed fracture: Secondary | ICD-10-CM

## 2024-10-18 MED ORDER — CEPHALEXIN 500 MG CAPSULE: 500.0000 mg | ORAL_CAPSULE | Freq: Four times a day (QID) | ORAL | 0 refills | Status: AC

## 2024-10-18 NOTE — Progress Notes (Signed)
 Orthopaedics [facility]    [address]  [place] [address]  Dept: [phone]  Dept Fax: [phone]        RETURN PATIENT OFFICE VISIT      PATIENT NAME:  Thomas Esparza  MEDICAL RECORD NUMBER: [id number]    DICTATING PHYSICIAN:  Powell Cure, PA-C  REFERRING PHYSICIAN:  Gasper Birmingham, MD    DOB:  [birth year]  DOS:  10/18/2024      CHIEF COMPLAINT:   Chief Complaint   Patient presents with    Follow Up        HISTORY OF PRESENT ILLNESS:    Thomas Esparza is a 64 y.o. male who presents to the office today for followup of his right hand 4th metacarpal shaft fracture, date of injury 09/23/2024.  He has been in an ulnar gutter cast that encompasses the lateral 3 digits.  We have discussed operative versus non operative management.  Patient has elected to proceed with non operative management understanding that he will have notable depression of the MCP joint as well as a probable extensor lag of the ring finger, both of which he has elected to accept.  He presents to clinic today for a routine check.  He states that he just noticed that his cast has been rubbing the webspace between the index and long finger.  He has also noticed a sore area along his thumb.  Patient has not contacted the office with any cast issues or for a cast change prior to coming in today for recheck.  No other complaints or concerns are voiced.      PAST MEDICAL HISTORY:  Past Medical History:   Diagnosis Date    Closed fracture of left distal radius            PAST SURGICAL HISTORY:  No past surgical history pertinent negatives on file.        FAMILY HISTORY:  Family Medical History:    None           SOCIAL HISTORY:  Social History     Socioeconomic History    Marital status: Single     Spouse name: Not on file    Number of children: Not on file    Years of education: Not on file    Highest education level: Not on file   Occupational History    Not on file   Tobacco Use    Smoking status:  Every Day    Smokeless tobacco: Current     Types: Chew   Vaping Use    Vaping status: Never Used   Substance and Sexual Activity    Alcohol use: Yes     Alcohol/week: 4.0 standard drinks of alcohol     Types: 4 Cans of beer per week    Drug use: Never    Sexual activity: Not on file   Other Topics Concern    Not on file   Social History Narrative    Not on file     Social Determinants of Health     Financial Resource Strain: Not on file   Transportation Needs: Not on file   Social Connections: Not on file   Intimate Partner Violence: Not on file   Housing Stability: Not on file       ALLERGIES:  Allergies[1]    MEDICATIONS:  Current Outpatient Medications   Medication Sig Dispense Refill    cephalexin  (KEFLEX ) 500 mg Oral Capsule Take  1 Capsule (500 mg total) by mouth Four times a day for 7 days 28 Capsule 0     No current facility-administered medications for this visit.         REVIEW OF SYMPTOMS:  Patient denies fever, chills, chest pain or shortness of breath. All other review of systems reviewed and were negative.        PHYSICAL EXAM:    Vitals:    10/18/24 1519   Weight: 77.6 kg (171 lb)   Height: 1.778 m (5' 10)   PainSc:   0 - No pain    Body mass index is 24.54 kg/m.    General: Healthy and cooperative,  no acute distress  Psychiatric: Normal mood and affect, Alert, oriented to time, person, place   Lungs:  Respirations symmetric and non-labored  Cardiac: Regular rate and rhythm     Skin:   No scars, lesions or rashes or erythema  Lymphatic: No lymphadenopathy  Neurologic: Dermatomal sensation intact    Musculoskeletal:    Today, his cast was removed.  There is a sore in the webspace of the index and ring finger more so along the inner border of the index finger; it is a couple of mm deep.  There is no drainage, discharge or surrounding erythema/cellulitis.  There is another sore along the webspace of the thumb that is more superficial.  Both of these areas were cleansed with soap and water and then  Betadine.     IMAGING:    X-rays of the right hand were obtained today and reviewed.  There is evidence of stable alignment of the shortened and displaced 4th metacarpal fracture.  Incomplete consolidation.    ASSESSMENT:      ICD-10-CM    1. Closed displaced fracture of shaft of fourth metacarpal bone of right hand with routine healing, subsequent encounter  S62.324D            PLAN:    Today we discussed his continued care.  He was given supplies to perform Betadine dressing changes over his wounds.  He was prescribed Keflex  500 mg q.i.d. x7 days.  He was transitioned to a wrist brace and buddy straps.  He was instructed to contact the office with any worsening of his wounds, increased pain or other concerns.  He understands that he needs to continue with protected activity and should avoid applying pressure on his hand especially in a fisted position.  We discussed risks of further fracture displacement.  Patient voiced understanding.  He wishes to resume non operative care.  We will see him back in 2 weeks for repeat x-ray and clinical assessment.    Patient seemed pleased and agreeable to this plan. All questions were answered to the patient's satisfaction.    This note was partially generated using MModal Fluency Direct system, and there may be some incorrect words, spellings, and punctuation that were not noted in checking the note before saving.      Powell Cure, PA-C 10/18/2024, 15:27         [1] No Known Allergies

## 2024-11-02 ENCOUNTER — Ambulatory Visit (INDEPENDENT_AMBULATORY_CARE_PROVIDER_SITE_OTHER): Payer: Self-pay | Admitting: Physician Assistant

## 2024-11-28 ENCOUNTER — Ambulatory Visit: Admitting: Cardiovascular Disease
